# Patient Record
Sex: Male | Born: 1960 | Race: White | Hispanic: No | Marital: Married | Smoking: Never smoker
Health system: Southern US, Community
[De-identification: ages and names within clinical notes are randomized; demographics above are authoritative.]

## PROBLEM LIST (undated history)

## (undated) DIAGNOSIS — I10 Essential (primary) hypertension: Secondary | ICD-10-CM

## (undated) DIAGNOSIS — J309 Allergic rhinitis, unspecified: Secondary | ICD-10-CM

## (undated) DIAGNOSIS — Z860100 Personal history of colon polyps, unspecified: Secondary | ICD-10-CM

## (undated) DIAGNOSIS — J019 Acute sinusitis, unspecified: Secondary | ICD-10-CM

## (undated) DIAGNOSIS — R51 Headache: Secondary | ICD-10-CM

## (undated) DIAGNOSIS — E7800 Pure hypercholesterolemia, unspecified: Secondary | ICD-10-CM

## (undated) DIAGNOSIS — K649 Unspecified hemorrhoids: Secondary | ICD-10-CM

## (undated) HISTORY — DX: Essential (primary) hypertension: I10

## (undated) HISTORY — DX: Personal history of colonic polyps: Z86.010

## (undated) HISTORY — DX: Personal history of colon polyps, unspecified: Z86.0100

## (undated) HISTORY — DX: Allergic rhinitis, unspecified: J30.9

## (undated) HISTORY — DX: Unspecified hemorrhoids: K64.9

## (undated) HISTORY — PX: COLONOSCOPY: SHX174

## (undated) HISTORY — DX: Acute sinusitis, unspecified: J01.90

## (undated) HISTORY — DX: Pure hypercholesterolemia, unspecified: E78.00

---

## 2001-01-09 ENCOUNTER — Encounter: Admission: RE | Admit: 2001-01-09 | Discharge: 2001-01-09 | Payer: Self-pay | Admitting: Family Medicine

## 2001-01-31 ENCOUNTER — Encounter: Admission: RE | Admit: 2001-01-31 | Discharge: 2001-01-31 | Payer: Self-pay | Admitting: Family Medicine

## 2001-04-16 ENCOUNTER — Encounter: Admission: RE | Admit: 2001-04-16 | Discharge: 2001-04-16 | Payer: Self-pay | Admitting: Family Medicine

## 2002-01-20 ENCOUNTER — Encounter: Admission: RE | Admit: 2002-01-20 | Discharge: 2002-01-20 | Payer: Self-pay | Admitting: Family Medicine

## 2005-02-03 ENCOUNTER — Ambulatory Visit: Payer: Self-pay | Admitting: Pulmonary Disease

## 2005-03-29 ENCOUNTER — Ambulatory Visit: Payer: Self-pay | Admitting: Pulmonary Disease

## 2006-04-09 ENCOUNTER — Ambulatory Visit: Payer: Self-pay | Admitting: Pulmonary Disease

## 2006-08-24 ENCOUNTER — Ambulatory Visit: Payer: Self-pay | Admitting: Pulmonary Disease

## 2007-07-10 ENCOUNTER — Ambulatory Visit: Payer: Self-pay | Admitting: Pulmonary Disease

## 2007-07-10 LAB — CONVERTED CEMR LAB
BUN: 8 mg/dL (ref 6–23)
Basophils Absolute: 0 10*3/uL (ref 0.0–0.1)
Basophils Relative: 0.8 % (ref 0.0–1.0)
CO2: 31 meq/L (ref 19–32)
Chloride: 102 meq/L (ref 96–112)
Cholesterol: 213 mg/dL (ref 0–200)
Eosinophils Relative: 0.7 % (ref 0.0–5.0)
GFR calc Af Amer: 103 mL/min
HCT: 45.7 % (ref 39.0–52.0)
Lymphocytes Relative: 37.7 % (ref 12.0–46.0)
MCV: 92.6 fL (ref 78.0–100.0)
Neutro Abs: 2.2 10*3/uL (ref 1.4–7.7)
Neutrophils Relative %: 49.8 % (ref 43.0–77.0)
Potassium: 4.4 meq/L (ref 3.5–5.1)
RBC: 4.94 M/uL (ref 4.22–5.81)
RDW: 11.7 % (ref 11.5–14.6)
Sodium: 140 meq/L (ref 135–145)
TSH: 1.32 microintl units/mL (ref 0.35–5.50)
Triglycerides: 39 mg/dL (ref 0–149)
VLDL: 8 mg/dL (ref 0–40)
WBC: 4.3 10*3/uL — ABNORMAL LOW (ref 4.5–10.5)

## 2007-07-12 ENCOUNTER — Telehealth (INDEPENDENT_AMBULATORY_CARE_PROVIDER_SITE_OTHER): Payer: Self-pay | Admitting: *Deleted

## 2007-07-15 ENCOUNTER — Telehealth (INDEPENDENT_AMBULATORY_CARE_PROVIDER_SITE_OTHER): Payer: Self-pay | Admitting: *Deleted

## 2008-02-13 ENCOUNTER — Ambulatory Visit: Payer: Self-pay | Admitting: Pulmonary Disease

## 2008-02-13 DIAGNOSIS — I10 Essential (primary) hypertension: Secondary | ICD-10-CM | POA: Insufficient documentation

## 2008-02-13 DIAGNOSIS — J309 Allergic rhinitis, unspecified: Secondary | ICD-10-CM | POA: Insufficient documentation

## 2008-02-17 ENCOUNTER — Telehealth (INDEPENDENT_AMBULATORY_CARE_PROVIDER_SITE_OTHER): Payer: Self-pay | Admitting: *Deleted

## 2008-03-13 ENCOUNTER — Telehealth: Payer: Self-pay | Admitting: Internal Medicine

## 2008-03-13 ENCOUNTER — Telehealth (INDEPENDENT_AMBULATORY_CARE_PROVIDER_SITE_OTHER): Payer: Self-pay | Admitting: *Deleted

## 2008-03-17 ENCOUNTER — Ambulatory Visit: Payer: Self-pay | Admitting: Internal Medicine

## 2008-07-07 ENCOUNTER — Ambulatory Visit: Payer: Self-pay | Admitting: Internal Medicine

## 2009-11-15 ENCOUNTER — Telehealth (INDEPENDENT_AMBULATORY_CARE_PROVIDER_SITE_OTHER): Payer: Self-pay | Admitting: *Deleted

## 2009-11-18 ENCOUNTER — Ambulatory Visit: Payer: Self-pay | Admitting: Pulmonary Disease

## 2009-11-24 ENCOUNTER — Ambulatory Visit: Payer: Self-pay | Admitting: Pulmonary Disease

## 2009-11-28 LAB — CONVERTED CEMR LAB
ALT: 28 units/L (ref 0–53)
AST: 23 units/L (ref 0–37)
Albumin: 3.9 g/dL (ref 3.5–5.2)
Alkaline Phosphatase: 78 units/L (ref 39–117)
BUN: 9 mg/dL (ref 6–23)
Bilirubin Urine: NEGATIVE
Calcium: 9.2 mg/dL (ref 8.4–10.5)
Chloride: 106 meq/L (ref 96–112)
Eosinophils Absolute: 0.1 10*3/uL (ref 0.0–0.7)
GFR calc non Af Amer: 108.67 mL/min (ref >60)
Glucose, Bld: 82 mg/dL (ref 70–99)
Hemoglobin: 14.8 g/dL (ref 13.0–17.0)
Leukocytes, UA: NEGATIVE
Lymphs Abs: 2 10*3/uL (ref 0.7–4.0)
MCV: 91.9 fL (ref 78.0–100.0)
Monocytes Relative: 9 % (ref 3.0–12.0)
Neutrophils Relative %: 49 % (ref 43.0–77.0)
Platelets: 222 10*3/uL (ref 150.0–400.0)
RDW: 11.9 % (ref 11.5–14.6)
Sodium: 144 meq/L (ref 135–145)
Specific Gravity, Urine: 1.02 (ref 1.000–1.030)
Total Bilirubin: 1.3 mg/dL — ABNORMAL HIGH (ref 0.3–1.2)
Triglycerides: 56 mg/dL (ref 0.0–149.0)
Urine Glucose: NEGATIVE mg/dL
WBC: 5.1 10*3/uL (ref 4.5–10.5)

## 2009-11-29 ENCOUNTER — Encounter: Payer: Self-pay | Admitting: Pulmonary Disease

## 2009-12-04 DIAGNOSIS — E785 Hyperlipidemia, unspecified: Secondary | ICD-10-CM

## 2009-12-09 ENCOUNTER — Encounter (INDEPENDENT_AMBULATORY_CARE_PROVIDER_SITE_OTHER): Payer: Self-pay | Admitting: *Deleted

## 2009-12-27 ENCOUNTER — Encounter (INDEPENDENT_AMBULATORY_CARE_PROVIDER_SITE_OTHER): Payer: Self-pay | Admitting: *Deleted

## 2009-12-29 ENCOUNTER — Ambulatory Visit: Payer: Self-pay | Admitting: Internal Medicine

## 2010-01-14 ENCOUNTER — Ambulatory Visit: Payer: Self-pay | Admitting: Internal Medicine

## 2010-01-21 ENCOUNTER — Encounter: Payer: Self-pay | Admitting: Internal Medicine

## 2010-07-22 ENCOUNTER — Telehealth (INDEPENDENT_AMBULATORY_CARE_PROVIDER_SITE_OTHER): Payer: Self-pay | Admitting: *Deleted

## 2010-07-29 ENCOUNTER — Ambulatory Visit: Payer: Self-pay | Admitting: Pulmonary Disease

## 2010-09-13 NOTE — Progress Notes (Signed)
Summary: labs  Phone Note Call from Patient Call back at Work Phone [phone]   Caller: Patient Call For: nadel Reason for Call: Talk to Nurse Summary of Call: pt would like to come in Thurssday morning 04/07 to have labs drawn for 04/13 appt with SN. Initial call taken by: Eugene Gavia,  November 15, 2009 3:10 PM  Follow-up for Phone Call        please advise if this is ok or not.  If ok, please advise what labs to draw along with dx codes.  thanks.  Aundra Millet Reynolds LPN  November 15, 2200 3:20 PM   labs are in computer for pt.  thanks Randell Loop CMA  November 16, 2009 9:27 AM   Additional Follow-up for Phone Call Additional follow up Details #1::        [facility] informing pt labs in computer for 11-18-2009.  Aundra Millet Reynolds LPN  November 16, 5425 10:00 AM

## 2010-09-13 NOTE — Assessment & Plan Note (Signed)
Summary: cpx/ mbw   Primary Care Provider:  Alroy Dust, MD  CC:  2.5 yr ROV & CPX....  History of Present Illness: 50 y/o WM here for a follow up visit & CPX... he enjoys excellent general medical health & has no new complaints or concerns... he takes Vasotec for mild HBP, otherw no regular meds...    Current Problems:   ALLERGIC RHINITIS (ICD-477.9) - hx allergic rhinitis symptoms treated OTC Prn... never had allergy testing etc...  HYPERTENSION (ICD-401.9) - hx mild HBP easily controlled w/ VASOTEC 5mg /d... BP= 136/80 & soimilar at home... tolerates med well & denies HA, fatigue, visual changes, CP, palipit, dizziness, syncope, dyspnea, edema, etc...   HYPERCHOLESTEROLEMIA, BORDERLINE (ICD-272.4) - hx mild elevations of TChol & LDL in the past... controlled on diet Rx alone...  ~  FLP 4/04 showed TChol 236, TG 64, HDL 56, LDL 167  ~  FLP 8/06 showed TChol 175, TG 43, HDL 49, LDL 118  ~  FLP 11/08 showed TChol 213, TG 39, HDL 63, LDL 136  ~  FLP 4/11 showed TChol 172, TG 56, HDL 53, LDL 107  HEMORRHOIDS (ICD-455.6) - he has ANUSOL HC cream for Prn use...  ~  4/11:  currently age 79 & due for screening colonoscopy...    Allergies (verified): No Known Drug Allergies  Comments:  Nurse/Medical Assistant: The patient's medications and allergies were reviewed with the patient and were updated in the Medication and Allergy Lists.  Past History:  Past Medical History:  SINUSITIS, ACUTE (ICD-461.9) ALLERGIC RHINITIS (ICD-477.9) HYPERTENSION (ICD-401.9) HYPERCHOLESTEROLEMIA, BORDERLINE (ICD-272.4) HEMORRHOIDS (ICD-455.6)  Family History: Father alive age 42 w/ HBP Mother died age 35 in MVA 1 Sister alive & well 2 Half-sisters in good general health  Social History: Married, Psychologist, occupational;  3 boys (preston 5, alexander 3, austin 1) never smoked occ etoh, socially Employ: Naval architect for [employer]  Review of Systems  The patient denies fever, chills,  sweats, anorexia, fatigue, weakness, malaise, weight loss, sleep disorder, blurring, diplopia, eye irritation, eye discharge, vision loss, eye pain, photophobia, earache, ear discharge, tinnitus, decreased hearing, nasal congestion, nosebleeds, sore throat, hoarseness, chest pain, palpitations, syncope, dyspnea on exertion, orthopnea, PND, peripheral edema, cough, dyspnea at rest, excessive sputum, hemoptysis, wheezing, pleurisy, nausea, vomiting, diarrhea, constipation, change in bowel habits, abdominal pain, melena, hematochezia, jaundice, gas/bloating, indigestion/heartburn, dysphagia, odynophagia, dysuria, hematuria, urinary frequency, urinary hesitancy, nocturia, incontinence, back pain, joint pain, joint swelling, muscle cramps, muscle weakness, stiffness, arthritis, sciatica, restless legs, leg pain at night, leg pain with exertion, rash, itching, dryness, suspicious lesions, paralysis, paresthesias, seizures, tremors, vertigo, transient blindness, frequent falls, frequent headaches, difficulty walking, depression, anxiety, memory loss, confusion, cold intolerance, heat intolerance, polydipsia, polyphagia, polyuria, unusual weight change, abnormal bruising, bleeding, enlarged lymph nodes, urticaria, allergic rash, hay fever, and recurrent infections.    Vital Signs:  Patient profile:   50 year old male Height:      68 inches Weight:      140.50 pounds BMI:     21.44 O2 Sat:      98 % on Room air Temp:     99.6 degrees F oral Pulse rate:   76 / minute BP sitting:   136 / 80  (right arm) Cuff size:   regular  Vitals Entered By: Randell Loop CMA (November 24, 2009 12:07 PM)  O2 Sat at Rest %:  98 O2 Flow:  Room air CC: 2.5 yr ROV & CPX... Is Patient Diabetic? No Pain Assessment Patient in pain?  no      Comments meds updated today   Physical Exam  Additional Exam:  WD, WN, 50 y/o WM in NAD... GENERAL:  Alert & oriented; pleasant & cooperative. HEENT:  /AT, EOM-wnl, PERRLA,  Fundi-benign, EACs-clear, TMs-wnl, NOSE-clear, THROAT-clear & wnl. NECK:  Supple w/ full ROM; no JVD; normal carotid impulses w/o bruits; no thyromegaly or nodules palpated; no lymphadenopathy. CHEST:  Clear to P & A; without wheezes/ rales/ or rhonchi. HEART:  Regular Rhythm; without murmurs/ rubs/ or gallops. ABDOMEN:  Soft & nontender; normal bowel sounds; no organomegaly or masses detected. RECTAL:  Neg - prostate 2+ & nontender w/o nodules; stool hematest neg. EXT: without deformities or arthritic changes; no varicose veins/ venous insuffic/ or edema. NEURO:  CN's intact; motor testing normal; sensory testing normal; gait normal & balance OK. DERM:  No lesions noted; no rash etc...     CXR  Procedure date:  11/24/2009  Findings:      CHEST - 2 VIEW Comparison: 08/24/2006   Findings:  The heart size and mediastinal contours are within normal limits.  Both lungs are clear.  The visualized skeletal structures are unremarkable.   IMPRESSION: No active cardiopulmonary disease.   Read By:  Jonne Ply,  M.D.   EKG  Procedure date:  11/24/2009  Findings:      Normal sinus rhythm with rate of:  76/ min... Tracing is WNL.Marland Kitchen. sl IVCD, NAD...  SN   MISC. Report  Procedure date:  11/24/2009  Findings:      Lipid Panel (LIPID)   Cholesterol               172 mg/dL                   1-610   Triglycerides             56.0 mg/dL                  9.6-045.4   HDL                       09.81 mg/dL                 >19.14   LDL Cholesterol      [H]  782 mg/dL                   9-56  BMP (METABOL)   Sodium                    144 mEq/L                   135-145   Potassium                 4.0 mEq/L                   3.5-5.1   Chloride                  106 mEq/L                   96-112   Carbon Dioxide            31 mEq/L                    19-32   Glucose  82 mg/dL                    16-10   BUN                       9 mg/dL                     9-60    Creatinine                0.8 mg/dL                   4.5-4.0   Calcium                   9.2 mg/dL                   9.8-11.9   GFR                       108.67 mL/min               >60  Hepatic/Liver Function Panel (HEPATIC)   Total Bilirubin      [H]  1.3 mg/dL                   1.4-7.8   Direct Bilirubin          0.2 mg/dL                   2.9-5.6   Alkaline Phosphatase      78 U/L                      39-117   AST                       23 U/L                      0-37   ALT                       28 U/L                      0-53   Total Protein             6.9 g/dL                    2.1-3.0   Albumin                   3.9 g/dL                    8.6-5.7  Comments:      CBC Platelet w/Diff (CBCD)   White Cell Count          5.1 K/uL                    4.5-10.5   Red Cell Count            4.73 Mil/uL                 4.22-5.81   Hemoglobin                14.8 g/dL                   84.6-96.0  Hematocrit                43.4 %                      39.0-52.0   MCV                       91.9 fl                     78.0-100.0   Platelet Count            222.0 K/uL                  150.0-400.0   Neutrophil %              49.0 %                      43.0-77.0   Lymphocyte %              39.8 %                      12.0-46.0   Monocyte %                9.0 %                       3.0-12.0   Eosinophils%              1.5 %                       0.0-5.0   Basophils %               0.7 %                       0.0-3.0  TSH (TSH)   FastTSH                   1.48 uIU/mL                 0.35-5.50  UDip Only (UDIP)   Color                     YELLOW   Clarity                   CLEAR                       Clear   Specific Gravity          1.020                       1.000 - 1.030   Urine Ph                  5.5                         5.0-8.0   Protein                   NEGATIVE                    Negative   Urine Glucose             NEGATIVE  Negative   Ketones                    NEGATIVE                    Negative   Urine Bilirubin           NEGATIVE                    Negative   Blood                     NEGATIVE                    Negative   Urobilinogen              0.2                         0.0 - 1.0   Leukocyte Esterace        NEGATIVE                    Negative   Nitrite                   NEGATIVE                    Negative  Prostate Specific Antigen (PSA)   PSA-Hyb                   0.51 ng/mL                  0.10-4.00   Impression & Recommendations:  Problem # 1:  PHYSICAL EXAMINATION (ICD-V70.0)  Orders: 12 Lead EKG (12 Lead EKG) Gastroenterology Referral (GI) T-2 View CXR ([id number])  Problem # 2:  HYPERTENSION (ICD-401.9) Controlled-  continue same med. His updated medication list for this problem includes:    Enalapril Maleate 5 Mg Tabs (Enalapril maleate) .Marland Kitchen... Take 1 tab by mouth once daily...  Orders: Prescription Created Electronically [phone])  Problem # 3:  HYPERCHOLESTEROLEMIA, BORDERLINE (ICD-272.4) FLP is improved-  continue diet Rx...  Problem # 4:  HEMORRHOIDS (ICD-455.6) He is due for screening colonoscopy...  Complete Medication List: 1)  Enalapril Maleate 5 Mg Tabs (Enalapril maleate) .... Take 1 tab by mouth once daily...  Patient Instructions: 1)  Today we updated your med list- see below.... 2)  We refilled your Enalapril for a 90 d supply as requested... 3)  Today we reviewed your recent blood work, and we did your follow up CXR & EKG>> please call the "phone tree" in a few days for your Xray results.Marland KitchenMarland Kitchen 4)  We will arrange for a colonoscopy to be sched at your convenience... 5)  Call for any problems.Marland KitchenMarland Kitchen 6)  Please schedule a follow-up appointment in 1 year. Prescriptions: ENALAPRIL MALEATE 5 MG TABS (ENALAPRIL MALEATE) take 1 tab by mouth once daily...  #90 x prn   Entered and Authorized by:   Michele Mcalpine MD   Signed by:   Michele Mcalpine MD on 11/24/2009   Method used:   Print then Give to Patient    RxID:   [id number]    CardioPerfect ECG  ID: [id number] Patient: Raymond Hayes DOB: 23-Aug-1960 Age: 50 Years Old Sex: Male Race: White Physician: Kristia Jupiter Technician: Randell Loop CMA Height: 68 Weight: 140.50 Status: Unconfirmed Past Medical History:  HYPERTENSION (ICD-401.9)--controlled on vasotec 5mg   once daily   HYPERLIPIDEMIA (ICD-272.4)--diet controlled.  Chol: 213 (07/10/2007)   HDL: 63.4 (07/10/2007)   LDL: 136.4 (07/10/2007)   TG: 39 (07/10/2007) SGOT: 25 (07/10/2007)   SGPT: 24 (07/10/2007)--  ALLERGIC RHINITIS (ICD-477.9)--intermittent flares.   Hemorrhoids. --GI eval. 9/09- rx anusol, declined colonoscopy at that time.    Td 2007 Recorded: 11/24/2009 12:18 AM P/PR: 95 ms / 148 ms - Heart rate (maximum exercise) QRS: 109 QT/QTc/QTd: 400 ms / 428 ms / 30 ms - Heart rate (maximum exercise)  P/QRS/T axis: 79 deg / 91 deg / 61 deg - Heart rate (maximum exercise)  Heartrate: 76 bpm  Interpretation:  Normal sinus rhythm with rate of:  76/ min... Tracing is WNL.Marland Kitchen. sl IVCD, NAD...  SN

## 2010-09-13 NOTE — Procedures (Signed)
Summary: Colonoscopy  Patient: Raymond Hayes Note: All result statuses are Final unless otherwise noted.  Tests: (1) Colonoscopy (COL)   COL Colonoscopy           DONE     [facility]     [address]     [place], [place]  [address]           COLONOSCOPY PROCEDURE REPORT           PATIENT:  Raymond, Hayes  MR#:  [id number]     BIRTHDATE:  [birth year], 49 yrs. old  GENDER:  male     ENDOSCOPIST:  Iva Boop, MD, [facility]     REF. BY:     PROCEDURE DATE:  01/14/2010     PROCEDURE:  Colonoscopy with snare polypectomy     ASA CLASS:  Class I     INDICATIONS:  Routine Risk Screening     MEDICATIONS:   Fentanyl 75 mcg IV, Versed mg IV           DESCRIPTION OF PROCEDURE:   After the risks benefits and     alternatives of the procedure were thoroughly explained, informed     consent was obtained.  Digital rectal exam was performed and     revealed no abnormalities and normal prostate.   The LB CF-[id number]     [id number] endoscope was introduced through the anus and advanced to     the cecum, which was identified by both the appendix and ileocecal     valve, without limitations.  The quality of the prep was     excellent, using MoviPrep.  The instrument was then slowly     withdrawn as the colon was fully examined.     Insertion: 2:30 minutes Withdrawal: 7:08 minutes     <<PROCEDUREIMAGES>>           FINDINGS:  A diminutive polyp was found in the rectum. It was 5 mm     in size. Polyp was snared without cautery. Retrieval was     successful. snare polyp  The terminal ileum appeared normal.  This     was otherwise a normal examination of the colon.   Retroflexed     views in the rectum revealed internal hemorrhoids.    The scope     was then withdrawn from the patient and the procedure completed.           COMPLICATIONS:  None     ENDOSCOPIC IMPRESSION:     1) 5 mm diminutive polyp in the rectum - removed     2) Internal hemorrhoids     3) Otherwise normal examination of colon and  terminal ileum           REPEAT EXAM:  In for Colonoscopy, pending biopsy results.           Iva Boop, MD, [facility]           CC:  The Patient     Alroy Dust, MD           n.     eSIGNED:   Iva Boop at 01/14/2010 03:47 PM           Mauricio Po, [id number]  Note: An exclamation mark (!) indicates a result that was not dispersed into the flowsheet. Document Creation Date: 01/14/2010 3:47 PM _______________________________________________________________________  (1) Order result status: Final Collection or observation date-time: 01/14/2010 15:38 Requested date-time:  Receipt date-time:  Reported date-time:  Referring Physician:   Ordering Physician: Stan Head [id number]) Specimen Source:  Source: Launa Grill Order Number: [id number] Lab site:   Appended Document: Colonoscopy     Procedures Next Due Date:    Colonoscopy: 01/2015

## 2010-09-13 NOTE — Miscellaneous (Signed)
Summary: [facility] Previsit/prep  Clinical Lists Changes  Medications: Added new medication of MOVIPREP 100 GM  SOLR (PEG-KCL-NACL-NASULF-NA ASC-C) As per prep instructions. - Signed Rx of MOVIPREP 100 GM  SOLR (PEG-KCL-NACL-NASULF-NA ASC-C) As per prep instructions.;  #1 x 0;  Signed;  Entered by: Wyona Almas RN;  Authorized by: Iva Boop MD, [facility];  Method used: Electronically to [facility] [address], [place], [place], [place]  [address], Ph: [phone], Fax: [phone] Observations: Added new observation of ALLERGY REV: Done (12/29/2009 13:00)    Prescriptions: MOVIPREP 100 GM  SOLR (PEG-KCL-NACL-NASULF-NA ASC-C) As per prep instructions.  #1 x 0   Entered by:   Wyona Almas RN   Authorized by:   Iva Boop MD, [facility]   Signed by:   Wyona Almas RN on 12/29/2009   Method used:   Electronically to        [facility] (retail)       [address]       [place], [place]  [address]       Ph: [phone]       Fax: [phone]   RxID:   [id number]

## 2010-09-13 NOTE — Letter (Signed)
Summary: Previsit letter  [facility]  [address] [place], [place] [address]   Phone: [phone]  Fax: [phone]       12/09/2009 MRN: [id number]  [facility] [place] [address], [place]  [address]  Dear Mr. Raymond Hayes,  Welcome to the Gastroenterology Division at [facility].    You are scheduled to see a nurse for your pre-procedure visit on 12/29/2009 at 1:00PM on the 3rd floor at [facility], [address]  We ask that you try to arrive at our office 15 minutes prior to your appointment time to allow for check-in.  Your nurse visit will consist of discussing your medical and surgical history, your immediate family medical history, and your medications.    Please bring a complete list of all your medications or, if you prefer, bring the medication bottles and we will list them.  We will need to be aware of both prescribed and over the counter drugs.  We will need to know exact dosage information as well.  If you are on blood thinners (Coumadin, Plavix, Aggrenox, Ticlid, etc.) please call our office today/prior to your appointment, as we need to consult with your physician about holding your medication.   Please be prepared to read and sign documents such as consent forms, a financial agreement, and acknowledgement forms.  If necessary, and with your consent, a friend or relative is welcome to sit-in on the nurse visit with you.  Please bring your insurance card so that we may make a copy of it.  If your insurance requires a referral to see a specialist, please bring your referral form from your primary care physician.  No co-pay is required for this nurse visit.     If you cannot keep your appointment, please call [phone] to cancel or reschedule prior to your appointment date.  This allows [place] the opportunity to schedule an appointment for another patient in need of care.    Thank you for choosing [facility] for your medical needs.  We  appreciate the opportunity to care for you.  Please visit [place] at our website  to learn more about our practice.                     Sincerely.                                                                                                                   The Gastroenterology Division

## 2010-09-13 NOTE — Letter (Signed)
SummaryScience writer Pulmonary Care Appointment Letter  [facility]  [address]   [place], [place] [address]   Phone: [phone]  Fax: [phone]    11/29/2009 MRN: [id number]  [facility] [address], [place]  [address]  Dear Raymond Hayes,   Our office is attempting to contact you about an appointment.  Please call our office at [phone] to schedule this appointment with Dr.________________.  Our registration staff is prepared to assist you with any questions you may have.    Thank you,   Nature conservation officer Pulmonary Division

## 2010-09-13 NOTE — Progress Notes (Signed)
Summary: bp problems-spouse called again-waiting on call back  Phone Note Call from Patient Call back at Home Phone [phone]   Caller: Spouse--stephanie Call For: nadel Reason for Call: Talk to Nurse Summary of Call: Patient's wife calling about husband's bp.  He has been feeling "off" for the past couple of weeks, yesterday he felt dizzy and when he checked his bp it was 184/95.  Patient is on bp medication, but wife did not know name of med.  Requesting appt. Initial call taken by: Lehman Prom,  July 22, 2010 9:03 AM  Follow-up for Phone Call        Called and spoke with pt.  He c/o feeling fatigued x 2 wks. When he woke up yesterday he felt dizzy and felt that way throughout the whole day, went to [facility] and had BP checked and it was 185/90.  He is currently taking enalapril 5 mg daily.  No appts available today pls advise, thanks! Follow-up by: Vernie Murders,  July 22, 2010 9:27 AM  Additional Follow-up for Phone Call Additional follow up Details #1::        spouse called again concerned that no one has called back yet. i advised that i spoke to [place]. she will get dr Jodelle Green recs as soon as she can and call back. i advised spouse that if she feels she needs to take pt to urgent care of er then pls do so. she says she hopes pt will be ok until they hear back from dr nadel. Tivis Ringer, CNA  July 22, 2010 2:23 PM  spoke with Marliss Czar, she is aware that pt has called back. Boone Master CNA/MA  July 22, 2010 2:43 PM     Additional Follow-up for Phone Call Additional follow up Details #2::    per SN----on vasotec 5mg    reminder for no salt in diet, no otc meds like decongestants--and increase the vasotec to  1 by mouth two times a day ---rov with SN next week or two---ok to add pt on friday at 10am.  thanks Randell Loop CMA  July 22, 2010 3:04 PM   Spoke with pt and notified of recs per SN.  Pt verbalized understanding and appt was sched for 07/29/10.     Follow-up by: Vernie Murders,  July 22, 2010 3:16 PM

## 2010-09-13 NOTE — Letter (Signed)
Summary: [facility] Instructions  [facility]  [address] [place], [place] [address]   Phone: [phone]  Fax: [phone]       Raymond Hayes    05/16/1961    MRN: [id number]        Procedure Day Dorna Bloom:  Farrell Ours  01/14/10     Arrival Time:  12:30PM     Procedure Time:  1:30PM     Location of Procedure:                    _X _  [facility] (4th Floor)                       PREPARATION FOR COLONOSCOPY WITH MOVIPREP   Starting 5 days prior to your procedure 01/09/10 do not eat nuts, seeds, popcorn, corn, beans, peas,  salads, or any raw vegetables.  Do not take any fiber supplements (e.g. Metamucil, Citrucel, and Benefiber).  THE DAY BEFORE YOUR PROCEDURE         DATE: 01/13/10  DAY: THURSDAY  1.  Drink clear liquids the entire day-NO SOLID FOOD  2.  Do not drink anything colored red or purple.  Avoid juices with pulp.  No orange juice.  3.  Drink at least 64 oz. (8 glasses) of fluid/clear liquids during the day to prevent dehydration and help the prep work efficiently.  CLEAR LIQUIDS INCLUDE: Water Jello Ice Popsicles Tea (sugar ok, no milk/cream) Powdered fruit flavored drinks Coffee (sugar ok, no milk/cream) Gatorade Juice: apple, white grape, white cranberry  Lemonade Clear bullion, consomm, broth Carbonated beverages (any kind) Strained chicken noodle soup Hard Candy                             4.  In the morning, mix first dose of MoviPrep solution:    Empty 1 Pouch A and 1 Pouch B into the disposable container    Add lukewarm drinking water to the top line of the container. Mix to dissolve    Refrigerate (mixed solution should be used within 24 hrs)  5.  Begin drinking the prep at 5:00 p.m. The MoviPrep container is divided by 4 marks.   Every 15 minutes drink the solution down to the next mark (approximately 8 oz) until the full liter is complete.   6.  Follow completed prep with 16 oz of clear liquid of your choice (Nothing red  or purple).  Continue to drink clear liquids until bedtime.  7.  Before going to bed, mix second dose of MoviPrep solution:    Empty 1 Pouch A and 1 Pouch B into the disposable container    Add lukewarm drinking water to the top line of the container. Mix to dissolve    Refrigerate  THE DAY OF YOUR PROCEDURE      DATE: 01/14/10  DAY: FRIDAY  Beginning at 8:30AM (5 hours before procedure):         1. Every 15 minutes, drink the solution down to the next mark (approx 8 oz) until the full liter is complete.  2. Follow completed prep with 16 oz. of clear liquid of your choice.    3. You may drink clear liquids until 11:30AM (2 HOURS BEFORE PROCEDURE).   MEDICATION INSTRUCTIONS  Unless otherwise instructed, you should take regular prescription medications with a small sip of water   as early as possible the morning of  your procedure.         OTHER INSTRUCTIONS  You will need a responsible adult at least 51 years of age to accompany you and drive you home.   This person must remain in the waiting room during your procedure.  Wear loose fitting clothing that is easily removed.  Leave jewelry and other valuables at home.  However, you may wish to bring a book to read or  an iPod/MP3 player to listen to music as you wait for your procedure to start.  Remove all body piercing jewelry and leave at home.  Total time from sign-in until discharge is approximately 2-3 hours.  You should go home directly after your procedure and rest.  You can resume normal activities the  day after your procedure.  The day of your procedure you should not:   Drive   Make legal decisions   Operate machinery   Drink alcohol   Return to work  You will receive specific instructions about eating, activities and medications before you leave.    The above instructions have been reviewed and explained to me by   Wyona Almas RN  Dec 29, 2009 1:26 PM     I fully understand and can  verbalize these instructions _____________________________ Date _________

## 2010-09-13 NOTE — Letter (Signed)
Summary: Patient Notice- Polyp Results  [facility]  [address] [place], [place] [address]   Phone: [phone]  Fax: [phone]        January 21, 2010 MRN: [id number]    [facility] [address] [place], [place]  [address]    Dear Raymond Hayes,  The polyp removed from your colon was adenomatous. This means that it was pre-cancerous or that  it had the potential to change into cancer over time.  I recommend that you have a repeat colonoscopy in 5 years to determine if you have developed any new polyps over time. If you develop any new rectal bleeding, abdominal pain or significant bowel habit changes, please contact us before then.  Please call us if you are having persistent problems or have questions about your condition that have not been fully answered at this time.  Sincerely,  Iva Boop MD, [facility]  This letter has been electronically signed by your physician.  Appended Document: Patient Notice- Polyp Results letter mailed.

## 2010-09-15 NOTE — Assessment & Plan Note (Signed)
Summary: elevated BP//lmr   Primary Care Provider:  Alroy Dust, MD  CC:  Add-on appt for elev BP....  History of Present Illness: 50 y/o WM here for a follow up visit due to HBP...    ~  July 29, 2010:  he has been on Vasotec 5mg /d for mild HBP w/ good control... recently BP elev at home  ~180/90 by report assoc w/ fatigue, dizziness, etc... we increased Vasotec to 5mg  Bid by phone & BPs improved- he brings home med list w/ pressures up to 146/ 86 on this dose however... we discussed ch Rx to once daily pill> try LOTREL 5/10 daily...   Current Problems:   ALLERGIC RHINITIS (ICD-477.9) - hx allergic rhinitis symptoms treated OTC Prn... never had allergy testing etc...  HYPERTENSION (ICD-401.9) - hx mild HBP prev controlled w/ Vasotec 5mg /d... BP= 122/80 today & similar at home... tolerates med well & denies HA, fatigue, visual changes, CP, palipit, dizziness, syncope, dyspnea, edema, etc...   ~  12/11:  pt called w/ incr BP at home up to 180/90 & we decided to ch to LOTREL 5/10 daily...  HYPERCHOLESTEROLEMIA, BORDERLINE (ICD-272.4) - hx mild elevations of TChol & LDL in the past... controlled on diet Rx alone...  ~  FLP 4/04 showed TChol 236, TG 64, HDL 56, LDL 167  ~  FLP 8/06 showed TChol 175, TG 43, HDL 49, LDL 118  ~  FLP 11/08 showed TChol 213, TG 39, HDL 63, LDL 136  ~  FLP 4/11 showed TChol 172, TG 56, HDL 53, LDL 107  COLONIC POLYPS (ICD-211.3) - he had routine colonoscopy by DrGessner 6/11 at age 10 > showing 5mm polyp in rectum & bx= tub adenoma, & int hem...  HEMORRHOIDS (ICD-455.6) - he has ANUSOL HC cream for Prn use...   Preventive Screening-Counseling & Management  Alcohol-Tobacco     Alcohol type: social     Smoking Status: never  Allergies (verified): No Known Drug Allergies  Comments:  Nurse/Medical Assistant: The patient's medications and allergies were reviewed with the patient and were updated in the Medication and Allergy Lists.  Past  History:  Past Medical History: SINUSITIS, ACUTE (ICD-461.9) ALLERGIC RHINITIS (ICD-477.9) HYPERTENSION (ICD-401.9) HYPERCHOLESTEROLEMIA, BORDERLINE (ICD-272.4) COLONIC POLYPS (ICD-211.3) HEMORRHOIDS (ICD-455.6)  Family History: Reviewed history from 11/24/2009 and no changes required. Father alive age 80 w/ HBP Mother died age 33 in MVA 1 Sister alive & well 2 Half-sisters in good general health  Social History: Reviewed history from 11/24/2009 and no changes required. Married, wife= [place];  3 boys (preston 5, alexander 3, austin 1) never smoked occ etoh, socially Employ: Naval architect for [employer]  Review of Systems      See HPI  The patient denies anorexia, fever, weight loss, weight gain, vision loss, decreased hearing, hoarseness, chest pain, syncope, dyspnea on exertion, peripheral edema, prolonged cough, headaches, hemoptysis, abdominal pain, melena, hematochezia, severe indigestion/heartburn, hematuria, incontinence, muscle weakness, suspicious skin lesions, transient blindness, difficulty walking, depression, unusual weight change, abnormal bleeding, enlarged lymph nodes, and angioedema.    Vital Signs:  Patient profile:   50 year old male Height:      68 inches Weight:      144.50 pounds BMI:     22.05 O2 Sat:      100 % on Room air Temp:     97.7 degrees F oral Pulse rate:   80 / minute BP sitting:   122 / 80  (left arm) Cuff size:   regular  Vitals Entered  By: Randell Loop CMA (July 29, 2010 10:13 AM)  O2 Sat at Rest %:  100 O2 Flow:  Room air CC: Add-on appt for elev BP... Is Patient Diabetic? No Pain Assessment Patient in pain? no      Comments meds updated today with pt   Physical Exam  Additional Exam:  WD, WN, 50 y/o WM in NAD... GENERAL:  Alert & oriented; pleasant & cooperative. HEENT:  [place]/AT, EOM-wnl, PERRLA, Fundi-benign, EACs-clear, TMs-wnl, NOSE-clear, THROAT-clear & wnl. NECK:  Supple w/ full ROM; no JVD; normal  carotid impulses w/o bruits; no thyromegaly or nodules palpated; no lymphadenopathy. CHEST:  Clear to P & A; without wheezes/ rales/ or rhonchi. HEART:  Regular Rhythm; without murmurs/ rubs/ or gallops. ABDOMEN:  Soft & nontender; normal bowel sounds; no organomegaly or masses detected. RECTAL:  Neg - prostate 2+ & nontender w/o nodules; stool hematest neg. EXT: without deformities or arthritic changes; no varicose veins/ venous insuffic/ or edema. NEURO:  CN's intact; motor testing normal; sensory testing normal; gait normal & balance OK. DERM:  No lesions noted; no rash etc...    Impression & Recommendations:  Problem # 1:  HYPERTENSION (ICD-401.9) As noted>  we decided to change to LOTREL 5/10... The following medications were removed from the medication list:    Enalapril Maleate 5 Mg Tabs (Enalapril maleate) .Marland Kitchen... Take 1 tab by mouth two times a day His updated medication list for this problem includes:    Amlodipine Besy-benazepril Hcl 5-10 Mg Caps (Amlodipine besy-benazepril hcl) .Marland Kitchen... Take 1 tab by mouth once daily...  Problem # 2:  HYPERCHOLESTEROLEMIA, BORDERLINE (ICD-272.4) On diet & exercise...  Problem # 3:  COLONIC POLYPS (ICD-211.3) s/p colonoscopy 6/11 by DrGessner w/ tub adenoma removed... f/u 45yrs.  Complete Medication List: 1)  Amlodipine Besy-benazepril Hcl 5-10 Mg Caps (Amlodipine besy-benazepril hcl) .... Take 1 tab by mouth once daily...  Patient Instructions: 1)  Today we updated your med list- see below.... 2)  We decided to change your BP med to a combination pill called LOTREL (Amlodipine/ Benazepril) take one tab daily.Marland KitchenMarland Kitchen 3)  Continue to monitor your BP at home... 4)  Call for any questions... Prescriptions: AMLODIPINE BESY-BENAZEPRIL HCL 5-10 MG CAPS (AMLODIPINE BESY-BENAZEPRIL HCL) take 1 tab by mouth once daily...  #90 x 4   Entered and Authorized by:   Michele Mcalpine MD   Signed by:   Michele Mcalpine MD on 07/29/2010   Method used:   Print then Give  to Patient   RxID:   [id number]    Immunization History:  Influenza Immunization History:    Influenza:  historical (05/24/2010)

## 2010-10-06 ENCOUNTER — Telehealth (INDEPENDENT_AMBULATORY_CARE_PROVIDER_SITE_OTHER): Payer: Self-pay | Admitting: *Deleted

## 2010-10-11 NOTE — Progress Notes (Signed)
Summary: Lotrel Refill  Phone Note Call from Patient Call back at Home Phone [phone]   Caller: Spouse--stephanie--[phone] Call For: nadel Reason for Call: Refill Medication, Talk to Nurse Summary of Call: Patient saw Dr. Kriste Basque in December and SN changed his bp med.  Patient has since lost the prescription and is asking [place] to refill.  LOTREL (Amlodipine/ Benazepril) [facility] Initial call taken by: Lehman Prom,  October 06, 2010 3:48 PM  Follow-up for Phone Call        med has been sent to the pharmacy with refills---attempted to call pt but line is busy--will try back Randell Loop [facility]  October 06, 2010 3:51 PM   Pt's spouse aware RX has been sent to the pharmacy.Michel Bickers [address] [facility]  October 06, 2010 4:14 PM    Prescriptions: AMLODIPINE BESY-BENAZEPRIL HCL 5-10 MG CAPS (AMLODIPINE BESY-BENAZEPRIL HCL) take 1 tab by mouth once daily...  #90 x 6   Entered by:   Randell Loop CMA   Authorized by:   Michele Mcalpine MD   Signed by:   Randell Loop CMA on 10/06/2010   Method used:   Electronically to        [facility] (retail)       [address]       [place], [place]  [address]       Ph: [phone]       Fax: [phone]   RxID:   [id number]

## 2011-03-03 ENCOUNTER — Telehealth: Payer: Self-pay | Admitting: Pulmonary Disease

## 2011-03-03 MED ORDER — AMLODIPINE BESY-BENAZEPRIL HCL 5-10 MG PO CAPS: 1.0000 | ORAL_CAPSULE | Freq: Every day | ORAL | Status: DC

## 2011-03-03 NOTE — Telephone Encounter (Signed)
Spoke with pt wife and she states that the new insurance does not go into effect until 03-15-11 so that is why the pt wants to pick up RX to have on hane to send at that time. Rx printed and placed on SN look-at to sign. Pt aware that it will be ready to pick up later today. Carron Curie, CMA

## 2012-02-13 ENCOUNTER — Ambulatory Visit (INDEPENDENT_AMBULATORY_CARE_PROVIDER_SITE_OTHER): Payer: BC Managed Care – PPO | Admitting: Pulmonary Disease

## 2012-02-13 ENCOUNTER — Encounter: Payer: Self-pay | Admitting: Pulmonary Disease

## 2012-02-13 VITALS — BP 130/84 | HR 71 | Temp 97.1°F | Ht 68.0 in | Wt 146.4 lb

## 2012-02-13 DIAGNOSIS — D126 Benign neoplasm of colon, unspecified: Secondary | ICD-10-CM

## 2012-02-13 DIAGNOSIS — E785 Hyperlipidemia, unspecified: Secondary | ICD-10-CM

## 2012-02-13 DIAGNOSIS — J309 Allergic rhinitis, unspecified: Secondary | ICD-10-CM

## 2012-02-13 DIAGNOSIS — I10 Essential (primary) hypertension: Secondary | ICD-10-CM

## 2012-02-13 DIAGNOSIS — K649 Unspecified hemorrhoids: Secondary | ICD-10-CM

## 2012-02-13 MED ORDER — AMLODIPINE BESY-BENAZEPRIL HCL 5-10 MG PO CAPS: 1.0000 | ORAL_CAPSULE | Freq: Every day | ORAL | Status: DC

## 2012-02-13 NOTE — Patient Instructions (Addendum)
Today we updated your med list in our EPIC system...    Continue your current medications the same...  Call for any problems or if we can be of service in any way...  Let's plan a Physical next yr w/ Fasting blood work, CXR, & EKG.Marland KitchenMarland Kitchen

## 2012-02-13 NOTE — Progress Notes (Signed)
Subjective:     Patient ID: Raymond Hayes, male   DOB: Dec 05, 1960, 51 y.o.   MRN: [id number]  HPI 51 y/o WM here for a follow up visit due to HBP...   ~  July 29, 2010:  he has been on Vasotec 5mg /d for mild HBP w/ good control... recently BP elev at home ~180/90 by report assoc w/ fatigue, dizziness, etc... we increased Vasotec to 5mg  Bid by phone & BPs improved- he brings home med list w/ pressures up to 146/ 86 on this dose however... we discussed ch Rx to once daily pill> try LOTREL 5/10 daily...  ~  February 13, 2012:  65mo ROV & Raymond Hayes continues to do well> no new complaints or concerns;  BP remains well controlled on Lotrel 5-10 & he is tolerating very well;  We reviewed prev XRays & labs- he is not fasting today & wants to wait til next yr for follow up CPX & he is low risk...    We reviewed prob list, meds, xrays and labs> see below>>   Problem List:   ALLERGIC RHINITIS (ICD-477.9) - hx allergic rhinitis symptoms treated OTC Prn... never had allergy testing etc...  HYPERTENSION (ICD-401.9) - hx mild HBP prev controlled w/ Vasotec 5mg /d...  ~  4/11:  BP= 122/80 today & similar at home... tolerates med well & denies HA, fatigue, visual changes, CP, palipit, dizziness, syncope, dyspnea, edema, etc...  ~  12/11:  pt called w/ incr BP at home up to 180/90 & we decided to ch to LOTREL 5/10 daily... ~  7/13:  BP= 130/84 & he remains asymptomatic  HYPERCHOLESTEROLEMIA, BORDERLINE (ICD-272.4) - hx mild elevations of TChol & LDL in the past... controlled on diet Rx alone... ~  FLP 4/04 showed TChol 236, TG 64, HDL 56, LDL 167 ~  FLP 8/06 showed TChol 175, TG 43, HDL 49, LDL 118 ~  FLP 11/08 showed TChol 213, TG 39, HDL 63, LDL 136 ~  FLP 4/11 showed TChol 172, TG 56, HDL 53, LDL 107  COLONIC POLYPS (ICD-211.3) - he had routine colonoscopy by DrGessner 6/11 at age 83 > showing 5mm polyp in rectum & bx= tub adenoma, & int hem...  HEMORRHOIDS (ICD-455.6) - he has ANUSOL HC cream for Prn  use...   No past surgical history on file.   Outpatient Encounter Prescriptions as of 02/13/2012  Medication Sig Dispense Refill  . amLODipine-benazepril (LOTREL) 5-10 MG per capsule Take 1 capsule by mouth daily.  90 capsule  3    No Known Allergies   Current Medications, Allergies, Past Medical History, Past Surgical History, Family History, and Social History were reviewed in [employer] record.   Review of Systems        See HPI - all other systems neg except as noted...  The patient denies anorexia, fever, weight loss, weight gain, vision loss, decreased hearing, hoarseness, chest pain, syncope, dyspnea on exertion, peripheral edema, prolonged cough, headaches, hemoptysis, abdominal pain, melena, hematochezia, severe indigestion/heartburn, hematuria, incontinence, muscle weakness, suspicious skin lesions, transient blindness, difficulty walking, depression, unusual weight change, abnormal bleeding, enlarged lymph nodes, and angioedema.     Objective:   Physical Exam     WD, WN, 51 y/o WM in NAD... GENERAL:  Alert & oriented; pleasant & cooperative. HEENT:  [place]/AT, EOM-wnl, PERRLA, Fundi-benign, EACs-clear, TMs-wnl, NOSE-clear, THROAT-clear & wnl. NECK:  Supple w/ full ROM; no JVD; normal carotid impulses w/o bruits; no thyromegaly or nodules palpated; no lymphadenopathy. CHEST:  Clear  to P & A; without wheezes/ rales/ or rhonchi. HEART:  Regular Rhythm; without murmurs/ rubs/ or gallops. ABDOMEN:  Soft & nontender; normal bowel sounds; no organomegaly or masses detected. (RECTAL:  Neg - prostate 2+ & nontender w/o nodules; stool hematest neg) EXT: without deformities or arthritic changes; no varicose veins/ venous insuffic/ or edema. NEURO:  CN's intact; motor testing normal; sensory testing normal; gait normal & balance OK. DERM:  No lesions noted; no rash etc...  RADIOLOGY DATA:  Reviewed in the EPIC EMR & discussed w/ the patient...  LABORATORY  DATA:  Reviewed in the EPIC EMR & discussed w/ the patient...   Assessment:     HBP>  On Lotrel 5-10 w/ good control, tolerated well, continue same Rx...  Chol>  He has mild elev LDL in the past but doesn't want meds; "I'm a diet guy" he says; his weight is normal w/ BMI~22...  Hx Colon Polyps and Hems>  He had colonoscopy 2011 by drGessner w/ sm adenoma removed; has AnusolHC for prn use...     Plan:     Patient's Medications  New Prescriptions   No medications on file  Previous Medications   No medications on file  Modified Medications   Modified Medication Previous Medication   AMLODIPINE-BENAZEPRIL (LOTREL) 5-10 MG PER CAPSULE amLODipine-benazepril (LOTREL) 5-10 MG per capsule      Take 1 capsule by mouth daily.    Take 1 capsule by mouth daily.  Discontinued Medications   No medications on file

## 2012-04-05 ENCOUNTER — Telehealth: Payer: Self-pay | Admitting: Pulmonary Disease

## 2012-04-05 NOTE — Telephone Encounter (Signed)
Called and spoke with pt and he stated that he has been having this feeling of being dizzy--just not feeling like himself x 3 weeks.  He stated that he c/o of left foot feeling funny and now it is both feet.  Pt very concerned about this and requested to see SN for these symptoms.  appt has been made for pt to see SN on 8-27 at 2:30.  Pt is aware.

## 2012-04-09 ENCOUNTER — Encounter: Payer: Self-pay | Admitting: Pulmonary Disease

## 2012-04-09 ENCOUNTER — Ambulatory Visit (INDEPENDENT_AMBULATORY_CARE_PROVIDER_SITE_OTHER): Payer: BC Managed Care – PPO | Admitting: Pulmonary Disease

## 2012-04-09 VITALS — BP 126/80 | HR 73 | Temp 97.7°F | Ht 68.0 in | Wt 145.0 lb

## 2012-04-09 DIAGNOSIS — D126 Benign neoplasm of colon, unspecified: Secondary | ICD-10-CM

## 2012-04-09 DIAGNOSIS — R209 Unspecified disturbances of skin sensation: Secondary | ICD-10-CM

## 2012-04-09 DIAGNOSIS — E785 Hyperlipidemia, unspecified: Secondary | ICD-10-CM

## 2012-04-09 DIAGNOSIS — N139 Obstructive and reflux uropathy, unspecified: Secondary | ICD-10-CM

## 2012-04-09 DIAGNOSIS — I10 Essential (primary) hypertension: Secondary | ICD-10-CM

## 2012-04-09 DIAGNOSIS — R202 Paresthesia of skin: Secondary | ICD-10-CM

## 2012-04-09 DIAGNOSIS — N320 Bladder-neck obstruction: Secondary | ICD-10-CM

## 2012-04-09 NOTE — Progress Notes (Signed)
Subjective:     Patient ID: Raymond Hayes, male   DOB: 02/22/1961, 51 y.o.   MRN: [id number]  HPI 51 y/o WM here for a follow up visit due to HBP...   ~  July 29, 2010:  Raymond Hayes has been on Vasotec 5mg /d for mild HBP w/ good control... recently BP elev at home ~180/90 by report assoc w/ fatigue, dizziness, etc... we increased Vasotec to 5mg  Bid by phone & BPs improved- Raymond Hayes brings home med list w/ pressures up to 146/ 86 on this dose however... we discussed ch Rx to once daily pill> try LOTREL 5/10 daily...  ~  February 13, 2012:  63mo ROV & Audry continues to do well> no new complaints or concerns;  BP remains well controlled on Lotrel 5-10 & Raymond Hayes is tolerating very well;  We reviewed prev XRays & labs- Raymond Hayes is not fasting today & wants to wait til next yr for follow up CPX & Raymond Hayes is low risk...    We reviewed prob list, meds, xrays and labs> see below>>  ~  April 09, 2012:  29mo ROV & add-on for dizziness x 3wks "not feeling like myself" plus mult somatic complaints including transient episode of blurred vision, occipital HA, "flu-like feeling", unsteady- "about 10%" Raymond Hayes says, then tired/ exhausted==> gradually better now;  Also c/o paresthesias w/ a "cold sensation" in his feet, similar senation in his knees intermittently, not really a numbness & no prob at night in bed- just when Raymond Hayes is up & about, not a pain & no radiation, etc; this disturbance is really bothering him but I can't find any objective abn- so we discussed Neuro eval & we will refer...     We reviewed prob list, meds, xrays and labs> see below for updates >> LABS 8/13:  FLP- ok x LDL=121;  Chems- all wnl;  CBC- wnl;  TSH=1.12;  PSA= 0.42;  Sed=8...    Problem List:   ALLERGIC RHINITIS (ICD-477.9) - hx allergic rhinitis symptoms treated OTC Prn... never had allergy testing etc...  HYPERTENSION (ICD-401.9) - hx mild HBP prev controlled w/ Vasotec 5mg /d...  ~  4/11:  BP= 122/80 today & similar at home... tolerates med well & denies HA, fatigue,  visual changes, CP, palipit, dizziness, syncope, dyspnea, edema, etc...  ~  12/11:  pt called w/ incr BP at home up to 180/90 & we decided to ch to LOTREL 5/10 daily... ~  7/13:  BP= 130/84 & Raymond Hayes remains asymptomatic  HYPERCHOLESTEROLEMIA, BORDERLINE (ICD-272.4) - hx mild elevations of TChol & LDL in the past... controlled on diet Rx alone... ~  FLP 4/04 showed TChol 236, TG 64, HDL 56, LDL 167 ~  FLP 8/06 showed TChol 175, TG 43, HDL 49, LDL 118 ~  FLP 11/08 showed TChol 213, TG 39, HDL 63, LDL 136 ~  FLP 4/11 showed TChol 172, TG 56, HDL 53, LDL 107 ~  FLP 8/13 showed TChol 191, TG 61, HDL 58, LDL 121... Raymond Hayes does not want Statin meds, therefore diet rx.  COLONIC POLYPS (ICD-211.3) - Raymond Hayes had routine colonoscopy by DrGessner 6/11 at age 54 > showing 5mm polyp in rectum & bx= tub adenoma, & int hem...  HEMORRHOIDS (ICD-455.6) - Raymond Hayes has ANUSOL HC cream for Prn use...  PARESTHESIAS >> ~  8/13:  SEE ABOVE   No past surgical history on file.   Outpatient Encounter Prescriptions as of 04/09/2012  Medication Sig Dispense Refill  . amLODipine-benazepril (LOTREL) 5-10 MG per capsule Take 1 capsule  by mouth daily.  90 capsule  3    No Known Allergies   Current Medications, Allergies, Past Medical History, Past Surgical History, Family History, and Social History were reviewed in [employer] record.   Review of Systems        See HPI - all other systems neg except as noted...  The patient denies anorexia, fever, weight loss, weight gain, vision loss, decreased hearing, hoarseness, chest pain, syncope, dyspnea on exertion, peripheral edema, prolonged cough, headaches, hemoptysis, abdominal pain, melena, hematochezia, severe indigestion/heartburn, hematuria, incontinence, muscle weakness, suspicious skin lesions, transient blindness, difficulty walking, depression, unusual weight change, abnormal bleeding, enlarged lymph nodes, and angioedema.     Objective:   Physical  Exam     WD, WN, 51 y/o WM in NAD... GENERAL:  Alert & oriented; pleasant & cooperative. HEENT:  [place]/AT, EOM-wnl, PERRLA, Fundi-benign, EACs-clear, TMs-wnl, NOSE-clear, THROAT-clear & wnl. NECK:  Supple w/ full ROM; no JVD; normal carotid impulses w/o bruits; no thyromegaly or nodules palpated; no lymphadenopathy. CHEST:  Clear to P & A; without wheezes/ rales/ or rhonchi. HEART:  Regular Rhythm; without murmurs/ rubs/ or gallops. ABDOMEN:  Soft & nontender; normal bowel sounds; no organomegaly or masses detected. (RECTAL:  Neg - prostate 2+ & nontender w/o nodules; stool hematest neg) EXT: without deformities or arthritic changes; no varicose veins/ venous insuffic/ or edema. NEURO:  CN's intact; motor testing normal; sensory testing normal; gait normal & balance OK. DERM:  No lesions noted; no rash etc...  RADIOLOGY DATA:  Reviewed in the EPIC EMR & discussed w/ the patient...  LABORATORY DATA:  Reviewed in the EPIC EMR & discussed w/ the patient...   Assessment:     PARESTHESIAS>  I don't know what to make of his symptoms; Raymond Hayes is concerned & we discussed subspecialty eval by Neuro- we will refer.   HBP>  On Lotrel 5-10 w/ good control, tolerated well, continue same Rx...  Chol>  Raymond Hayes has mild elev LDL in the past but doesn't want meds; "I'm a diet guy" Raymond Hayes says; his weight is normal w/ BMI~22...  Hx Colon Polyps and Hems>  Raymond Hayes had colonoscopy 2011 by DrGessner w/ sm adenoma removed; has AnusolHC for prn use...     Plan:     Patient's Medications  New Prescriptions   No medications on file  Previous Medications   AMLODIPINE-BENAZEPRIL (LOTREL) 5-10 MG PER CAPSULE    Take 1 capsule by mouth daily.  Modified Medications   No medications on file  Discontinued Medications   No medications on file

## 2012-04-09 NOTE — Patient Instructions (Addendum)
Today we updated your med list in our EPIC system...    Continue your current medications the same...  Please return to our lab one morning this week for your FASTING blood work...  We will arrange for a NEUROLOGY consultation & we will call you w/ the date & time once this is arranged...  Call for any questions or concerns.Marland KitchenMarland KitchenMarland Kitchen

## 2012-04-11 ENCOUNTER — Other Ambulatory Visit (INDEPENDENT_AMBULATORY_CARE_PROVIDER_SITE_OTHER): Payer: BC Managed Care – PPO

## 2012-04-11 DIAGNOSIS — E785 Hyperlipidemia, unspecified: Secondary | ICD-10-CM

## 2012-04-11 DIAGNOSIS — D126 Benign neoplasm of colon, unspecified: Secondary | ICD-10-CM

## 2012-04-11 DIAGNOSIS — I10 Essential (primary) hypertension: Secondary | ICD-10-CM

## 2012-04-11 DIAGNOSIS — N139 Obstructive and reflux uropathy, unspecified: Secondary | ICD-10-CM

## 2012-04-11 DIAGNOSIS — R209 Unspecified disturbances of skin sensation: Secondary | ICD-10-CM

## 2012-04-11 DIAGNOSIS — N320 Bladder-neck obstruction: Secondary | ICD-10-CM

## 2012-04-11 DIAGNOSIS — R202 Paresthesia of skin: Secondary | ICD-10-CM

## 2012-04-11 LAB — HEPATIC FUNCTION PANEL
Alkaline Phosphatase: 69 U/L (ref 39–117)
Bilirubin, Direct: 0.2 mg/dL (ref 0.0–0.3)
Total Bilirubin: 1.7 mg/dL — ABNORMAL HIGH (ref 0.3–1.2)
Total Protein: 6.4 g/dL (ref 6.0–8.3)

## 2012-04-11 LAB — CBC WITH DIFFERENTIAL/PLATELET
Basophils Relative: 0.6 % (ref 0.0–3.0)
Eosinophils Absolute: 0.1 10*3/uL (ref 0.0–0.7)
HCT: 45.7 % (ref 39.0–52.0)
Hemoglobin: 15.2 g/dL (ref 13.0–17.0)
Lymphocytes Relative: 35.7 % (ref 12.0–46.0)
Lymphs Abs: 1.8 10*3/uL (ref 0.7–4.0)
MCHC: 33.3 g/dL (ref 30.0–36.0)
Neutro Abs: 2.8 10*3/uL (ref 1.4–7.7)
RBC: 4.95 Mil/uL (ref 4.22–5.81)
RDW: 12.1 % (ref 11.5–14.6)

## 2012-04-11 LAB — TSH: TSH: 1.12 u[IU]/mL (ref 0.35–5.50)

## 2012-04-11 LAB — LIPID PANEL
HDL: 57.7 mg/dL (ref >39.00)
LDL Cholesterol: 121 mg/dL — ABNORMAL HIGH (ref 0–99)
Total CHOL/HDL Ratio: 3
Triglycerides: 61 mg/dL (ref 0.0–149.0)
VLDL: 12.2 mg/dL (ref 0.0–40.0)

## 2012-04-11 LAB — BASIC METABOLIC PANEL
CO2: 28 mEq/L (ref 19–32)
Calcium: 9.2 mg/dL (ref 8.4–10.5)
Creatinine, Ser: 0.9 mg/dL (ref 0.4–1.5)
GFR: 90.48 mL/min (ref >60.00)
Sodium: 139 mEq/L (ref 135–145)

## 2012-04-14 ENCOUNTER — Encounter: Payer: Self-pay | Admitting: Pulmonary Disease

## 2012-07-09 ENCOUNTER — Telehealth: Payer: Self-pay | Admitting: Pulmonary Disease

## 2012-07-09 NOTE — Telephone Encounter (Signed)
ATC line busy x 3 wcb 

## 2012-07-09 NOTE — Telephone Encounter (Signed)
Called, spoke with pt.  States he was seen by [facility] Neuro for the foot pain and MRI was done.  Pt states he was told that "nothing was wrong" according to the MRI.  Pt states the pain has progressively gotten worse.  C/o his left leg being "mostly surface numb all day," lower left back being "surface numb all day," and the right leg from knee down feels the same way.  States he has an appt with the neurologist on Dec 10 but doesn't feel it is any need to keep this if they do not think anything is wrong.  Pt states he as been told that a neuro surgeon could help more than neuro and would like a referral.  Dr. Kriste Basque, pls advise.  Thank you.

## 2012-07-09 NOTE — Telephone Encounter (Signed)
Per SN---he will need to keep the appt with neurology and explain his continued symptoms----neurosurgeon will not see pt without an abnormal MRI.  thanks

## 2012-07-10 NOTE — Telephone Encounter (Signed)
LMTCBx1 on work number, Home number is still busy. Carron Curie, CMA

## 2012-07-12 NOTE — Telephone Encounter (Signed)
LMOMTCB x 1 

## 2012-07-15 NOTE — Telephone Encounter (Signed)
Per SN---this is appropriate and he will need to discuss with them his frustrations. SN can refer for a 2nd opinion to [facility] neurology or [facility] neurology---let us know after his follow up on 12-10 if he would like [place] to refer.  thanks

## 2012-07-15 NOTE — Telephone Encounter (Signed)
LMOMTCB x 1 

## 2012-07-15 NOTE — Telephone Encounter (Signed)
I spoke with the pt and he is frustrated with [facility] that is why he was asking to go to a neurosurgeon because he doesn't feel like anything is being done. He states he is to the point where he can barley walk at all on his leg. Pt wanted to know if there was another neurologists in town that he could see. He has an appt with [facility] on 07-23-12. He states he does not feel he will return after this appt,  if he goes,  because he is so upset with them. He states he is going to let them know his frustrations. Pt states he will have another MRI if that is needed to see another neurologists. Please advise if a new referral is appropriate. Carron Curie, CMA

## 2012-07-16 NOTE — Telephone Encounter (Signed)
LMOMTCB x 1 

## 2012-07-16 NOTE — Telephone Encounter (Signed)
Pt returned call.  Advised of SN's recs as stated below.  Pt would like the referral to [facility] placed today and if they can see him before 12.10.13 he would like to be seen and he will cancel the 12.10 appt with [facility].  Spoke with Leigh regarding the referral and she stated that SN wants pt to keep the 12.10.13 appt with [facility] BEFORE getting the referral.  LMOM TCB x1 for pt to inform him of this.

## 2012-07-17 NOTE — Telephone Encounter (Signed)
LMTCB

## 2012-07-17 NOTE — Telephone Encounter (Signed)
Pt advised and will keep appt with [facility]. Carron Curie, CMA

## 2012-07-26 ENCOUNTER — Telehealth: Payer: Self-pay | Admitting: Pulmonary Disease

## 2012-07-26 NOTE — Telephone Encounter (Signed)
Called and spoke with pt and he stated that he did keep his appt with the neurology and the requested that he repeat the MRI---this was done on 12-12.  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  He stated that he has an appt with neurosurgeon on Monday at [facility] neurosurgeon on [address] and he also requested a second opinion to be done at [facility].  He is still waiting on this appt at [facility].  He wanted to call back and make SN aware.  Nothing further is needed.

## 2012-07-26 NOTE — Telephone Encounter (Signed)
lmomtcb  

## 2012-07-30 ENCOUNTER — Other Ambulatory Visit: Payer: Self-pay | Admitting: Neurosurgery

## 2012-07-30 DIAGNOSIS — M5104 Intervertebral disc disorders with myelopathy, thoracic region: Secondary | ICD-10-CM

## 2012-07-31 ENCOUNTER — Other Ambulatory Visit: Payer: Self-pay | Admitting: Neurosurgery

## 2012-08-01 ENCOUNTER — Ambulatory Visit
Admission: RE | Admit: 2012-08-01 | Discharge: 2012-08-01 | Disposition: A | Discharge status: Home / Self Care | Payer: BC Managed Care – PPO | Source: Ambulatory Visit | Attending: Neurosurgery | Admitting: Neurosurgery

## 2012-08-01 DIAGNOSIS — M5104 Intervertebral disc disorders with myelopathy, thoracic region: Secondary | ICD-10-CM

## 2012-08-21 ENCOUNTER — Encounter (HOSPITAL_COMMUNITY): Payer: Self-pay | Admitting: Pharmacy Technician

## 2012-08-21 NOTE — Pre-Procedure Instructions (Addendum)
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  Report to [facility] at 11:00 noon.   Call this number if you have problems the morning of surgery: [phone]   Remember:Nothing to eat or drink after Midnight.   Take these medicines the morning of surgery with A SIP OF WATER: Amolodipine- Benazepril (Lotrel). Discontinue aspirin, Coumadin, Plavix, Effient, and herbal medications      Do not wear jewelry, make-up or nail polish.  Do not wear lotions, powders, or perfumes. You may wear deodorant.  Do not shave 48 hours prior to surgery. Men may shave face and neck.  Do not bring valuables to the hospital.  Contacts, dentures or bridgework may not be worn into surgery.  Leave suitcase in the car. After surgery it may be brought to your room.  For patients admitted to the hospital, checkout time is 11:00 AM the day of discharge.   Patients discharged the day of surgery will not be allowed to drive home.  Name and phone number of your driver: NA    Special Instructions: Shower using CHG 2 nights before surgery and the night before surgery.  If you shower the day of surgery use CHG.  Use special wash - you have one bottle of CHG for all showers.  You should use approximately 1/3 of the bottle for each shower.   Please read over the following fact sheets that you were given: Pain Booklet, Coughing and Deep Breathing and Surgical Site Infection Prevention

## 2012-08-22 ENCOUNTER — Encounter (HOSPITAL_COMMUNITY)
Admission: RE | Admit: 2012-08-22 | Discharge: 2012-08-22 | Disposition: A | Discharge status: Home / Self Care | Payer: BC Managed Care – PPO | Source: Ambulatory Visit | Attending: Anesthesiology | Admitting: Anesthesiology

## 2012-08-22 ENCOUNTER — Encounter (HOSPITAL_COMMUNITY): Payer: Self-pay

## 2012-08-22 ENCOUNTER — Encounter (HOSPITAL_COMMUNITY)
Admission: RE | Admit: 2012-08-22 | Discharge: 2012-08-22 | Disposition: A | Discharge status: Home / Self Care | Payer: BC Managed Care – PPO | Source: Ambulatory Visit | Attending: Neurosurgery | Admitting: Neurosurgery

## 2012-08-22 DIAGNOSIS — Z01812 Encounter for preprocedural laboratory examination: Secondary | ICD-10-CM | POA: Insufficient documentation

## 2012-08-22 DIAGNOSIS — Z01811 Encounter for preprocedural respiratory examination: Secondary | ICD-10-CM | POA: Insufficient documentation

## 2012-08-22 DIAGNOSIS — Z01810 Encounter for preprocedural cardiovascular examination: Secondary | ICD-10-CM | POA: Insufficient documentation

## 2012-08-22 DIAGNOSIS — Z01818 Encounter for other preprocedural examination: Secondary | ICD-10-CM | POA: Insufficient documentation

## 2012-08-22 HISTORY — DX: Headache: R51

## 2012-08-22 LAB — CBC
Hemoglobin: 15.5 g/dL (ref 13.0–17.0)
MCH: 30.8 pg (ref 26.0–34.0)
MCHC: 34.7 g/dL (ref 30.0–36.0)
Platelets: 226 10*3/uL (ref 150–400)
RDW: 11.8 % (ref 11.5–15.5)

## 2012-08-22 LAB — BASIC METABOLIC PANEL
BUN: 14 mg/dL (ref 6–23)
Calcium: 9.5 mg/dL (ref 8.4–10.5)
Creatinine, Ser: 0.81 mg/dL (ref 0.50–1.35)
GFR calc non Af Amer: >90 mL/min (ref >90)
Glucose, Bld: 85 mg/dL (ref 70–99)

## 2012-08-22 LAB — SURGICAL PCR SCREEN: MRSA, PCR: NEGATIVE

## 2012-08-22 NOTE — Progress Notes (Addendum)
Pt here for PAT appt.  Reports that his family physician is Dr. Nadel(Pulmonologist) in [place].  Denies any pulmonary, cardiac issues.  Had stress test over 17 years when going through stress. Denies sleep apnea/study.

## 2012-08-22 NOTE — Progress Notes (Signed)
Notified pt not to take Amlodipine-Benezapril DOS.  He verbalized understanding.

## 2012-08-22 NOTE — Progress Notes (Signed)
Pt refuses to sign consent.  Wishes to speak with surgeon. Will need consent signed DOS-note applied to outside of chart.

## 2012-08-28 MED ORDER — CEFAZOLIN SODIUM-DEXTROSE 2-3 GM-% IV SOLR
2.0000 g | INTRAVENOUS | Status: AC
  Administered 2012-08-29: 2 g via INTRAVENOUS
  Filled 2012-08-28: qty 50

## 2012-08-29 ENCOUNTER — Ambulatory Visit (HOSPITAL_COMMUNITY): Payer: BC Managed Care – PPO | Admitting: Anesthesiology

## 2012-08-29 ENCOUNTER — Encounter (HOSPITAL_COMMUNITY): Payer: Self-pay | Admitting: Anesthesiology

## 2012-08-29 ENCOUNTER — Ambulatory Visit (HOSPITAL_COMMUNITY): Payer: BC Managed Care – PPO

## 2012-08-29 ENCOUNTER — Encounter (HOSPITAL_COMMUNITY): Payer: Self-pay

## 2012-08-29 ENCOUNTER — Inpatient Hospital Stay (HOSPITAL_COMMUNITY)
Admission: RE | Admit: 2012-08-29 | Discharge: 2012-08-30 | DRG: 757 | Disposition: A | Discharge status: Home / Self Care | Payer: BC Managed Care – PPO | Source: Ambulatory Visit | Attending: Neurosurgery | Admitting: Neurosurgery

## 2012-08-29 ENCOUNTER — Encounter (HOSPITAL_COMMUNITY)
Admission: RE | Disposition: A | Discharge status: Home / Self Care | Payer: Self-pay | Source: Ambulatory Visit | Attending: Neurosurgery

## 2012-08-29 ENCOUNTER — Encounter (HOSPITAL_COMMUNITY): Payer: Self-pay | Admitting: *Deleted

## 2012-08-29 DIAGNOSIS — I10 Essential (primary) hypertension: Secondary | ICD-10-CM | POA: Diagnosis present

## 2012-08-29 DIAGNOSIS — Z01812 Encounter for preprocedural laboratory examination: Secondary | ICD-10-CM

## 2012-08-29 DIAGNOSIS — M4714 Other spondylosis with myelopathy, thoracic region: Secondary | ICD-10-CM | POA: Diagnosis present

## 2012-08-29 DIAGNOSIS — Z79899 Other long term (current) drug therapy: Secondary | ICD-10-CM

## 2012-08-29 DIAGNOSIS — Z01810 Encounter for preprocedural cardiovascular examination: Secondary | ICD-10-CM

## 2012-08-29 DIAGNOSIS — Z01818 Encounter for other preprocedural examination: Secondary | ICD-10-CM

## 2012-08-29 DIAGNOSIS — M5104 Intervertebral disc disorders with myelopathy, thoracic region: Principal | ICD-10-CM | POA: Diagnosis present

## 2012-08-29 HISTORY — PX: THORACIC DISCECTOMY: SHX6113

## 2012-08-29 SURGERY — THORACIC DISCECTOMY
Anesthesia: General | Site: Back | Laterality: Left | Wound class: Clean

## 2012-08-29 MED ORDER — LIDOCAINE HCL (CARDIAC) 20 MG/ML IV SOLN
INTRAVENOUS | Status: DC | PRN
  Administered 2012-08-29: 40 mg via INTRAVENOUS

## 2012-08-29 MED ORDER — HYDROCODONE-ACETAMINOPHEN 5-325 MG PO TABS
1.0000 | ORAL_TABLET | ORAL | Status: DC | PRN
  Administered 2012-08-29: 1 via ORAL
  Filled 2012-08-29: qty 1

## 2012-08-29 MED ORDER — SODIUM CHLORIDE 0.9 % IV SOLN
INTRAVENOUS | Status: AC
  Filled 2012-08-29: qty 500

## 2012-08-29 MED ORDER — METOCLOPRAMIDE HCL 5 MG/ML IJ SOLN: 10.0000 mg | Freq: Once | INTRAMUSCULAR | Status: DC | PRN

## 2012-08-29 MED ORDER — MENTHOL 3 MG MT LOZG: 1.0000 | LOZENGE | OROMUCOSAL | Status: DC | PRN

## 2012-08-29 MED ORDER — FLEET ENEMA 7-19 GM/118ML RE ENEM
1.0000 | ENEMA | Freq: Once | RECTAL | Status: AC | PRN
  Filled 2012-08-29: qty 1

## 2012-08-29 MED ORDER — 0.9 % SODIUM CHLORIDE (POUR BTL) OPTIME
TOPICAL | Status: DC | PRN
  Administered 2012-08-29: 1000 mL

## 2012-08-29 MED ORDER — ZOLPIDEM TARTRATE 5 MG PO TABS
5.0000 mg | ORAL_TABLET | Freq: Every evening | ORAL | Status: DC | PRN
  Filled 2012-08-29: qty 1

## 2012-08-29 MED ORDER — SODIUM CHLORIDE 0.9 % IV SOLN: 250.0000 mL | INTRAVENOUS | Status: DC

## 2012-08-29 MED ORDER — PROPOFOL INFUSION 10 MG/ML OPTIME
INTRAVENOUS | Status: DC | PRN
  Administered 2012-08-29: 75 ug/kg/min via INTRAVENOUS

## 2012-08-29 MED ORDER — DIAZEPAM 5 MG PO TABS: 5.0000 mg | ORAL_TABLET | Freq: Four times a day (QID) | ORAL | Status: DC | PRN

## 2012-08-29 MED ORDER — DOCUSATE SODIUM 100 MG PO CAPS
100.0000 mg | ORAL_CAPSULE | Freq: Two times a day (BID) | ORAL | Status: DC
  Administered 2012-08-29 – 2012-08-30 (×2): 100 mg via ORAL
  Filled 2012-08-29 (×3): qty 1

## 2012-08-29 MED ORDER — PHENYLEPHRINE HCL 10 MG/ML IJ SOLN
10.0000 mg | INTRAVENOUS | Status: DC | PRN
  Administered 2012-08-29: 10 ug/min via INTRAVENOUS

## 2012-08-29 MED ORDER — ALUM & MAG HYDROXIDE-SIMETH 200-200-20 MG/5ML PO SUSP: 30.0000 mL | Freq: Four times a day (QID) | ORAL | Status: DC | PRN

## 2012-08-29 MED ORDER — AMLODIPINE BESY-BENAZEPRIL HCL 5-10 MG PO CAPS: 1.0000 | ORAL_CAPSULE | Freq: Every day | ORAL | Status: DC

## 2012-08-29 MED ORDER — ACETAMINOPHEN 650 MG RE SUPP: 650.0000 mg | RECTAL | Status: DC | PRN

## 2012-08-29 MED ORDER — SENNA 8.6 MG PO TABS
1.0000 | ORAL_TABLET | Freq: Two times a day (BID) | ORAL | Status: DC
  Administered 2012-08-29 – 2012-08-30 (×2): 8.6 mg via ORAL
  Filled 2012-08-29 (×3): qty 1

## 2012-08-29 MED ORDER — SENNOSIDES-DOCUSATE SODIUM 8.6-50 MG PO TABS
1.0000 | ORAL_TABLET | Freq: Every evening | ORAL | Status: DC | PRN
  Filled 2012-08-29: qty 1

## 2012-08-29 MED ORDER — ACETAMINOPHEN 325 MG PO TABS: 650.0000 mg | ORAL_TABLET | ORAL | Status: DC | PRN

## 2012-08-29 MED ORDER — KCL IN DEXTROSE-NACL 20-5-0.45 MEQ/L-%-% IV SOLN
INTRAVENOUS | Status: DC
  Administered 2012-08-29: 20:00:00 via INTRAVENOUS
  Filled 2012-08-29 (×3): qty 1000

## 2012-08-29 MED ORDER — AMLODIPINE BESYLATE 5 MG PO TABS
5.0000 mg | ORAL_TABLET | Freq: Every day | ORAL | Status: DC
  Administered 2012-08-30: 5 mg via ORAL
  Filled 2012-08-29: qty 1

## 2012-08-29 MED ORDER — OXYCODONE HCL 5 MG PO TABS: 5.0000 mg | ORAL_TABLET | Freq: Once | ORAL | Status: DC | PRN

## 2012-08-29 MED ORDER — ONDANSETRON HCL 4 MG/2ML IJ SOLN: 4.0000 mg | INTRAMUSCULAR | Status: DC | PRN

## 2012-08-29 MED ORDER — PHENOL 1.4 % MT LIQD: 1.0000 | OROMUCOSAL | Status: DC | PRN

## 2012-08-29 MED ORDER — LACTATED RINGERS IV SOLN
INTRAVENOUS | Status: DC | PRN
  Administered 2012-08-29 (×2): via INTRAVENOUS

## 2012-08-29 MED ORDER — PANTOPRAZOLE SODIUM 40 MG IV SOLR
40.0000 mg | Freq: Every day | INTRAVENOUS | Status: DC
  Administered 2012-08-29: 40 mg via INTRAVENOUS
  Filled 2012-08-29 (×2): qty 40

## 2012-08-29 MED ORDER — MORPHINE SULFATE 2 MG/ML IJ SOLN: 1.0000 mg | INTRAMUSCULAR | Status: DC | PRN

## 2012-08-29 MED ORDER — BISACODYL 10 MG RE SUPP: 10.0000 mg | Freq: Every day | RECTAL | Status: DC | PRN

## 2012-08-29 MED ORDER — PROPOFOL 10 MG/ML IV BOLUS
INTRAVENOUS | Status: DC | PRN
  Administered 2012-08-29: 200 mg via INTRAVENOUS

## 2012-08-29 MED ORDER — OXYCODONE HCL 5 MG/5ML PO SOLN: 5.0000 mg | Freq: Once | ORAL | Status: DC | PRN

## 2012-08-29 MED ORDER — LIDOCAINE-EPINEPHRINE 1 %-1:100000 IJ SOLN
INTRAMUSCULAR | Status: DC | PRN
  Administered 2012-08-29: 5 mL

## 2012-08-29 MED ORDER — HYDROMORPHONE HCL PF 1 MG/ML IJ SOLN: 0.2500 mg | INTRAMUSCULAR | Status: DC | PRN

## 2012-08-29 MED ORDER — DEXAMETHASONE SODIUM PHOSPHATE 4 MG/ML IJ SOLN
4.0000 mg | Freq: Four times a day (QID) | INTRAMUSCULAR | Status: DC
  Administered 2012-08-29 – 2012-08-30 (×3): 4 mg via INTRAVENOUS
  Filled 2012-08-29 (×9): qty 1

## 2012-08-29 MED ORDER — SUFENTANIL CITRATE 50 MCG/ML IV SOLN
INTRAVENOUS | Status: DC | PRN
  Administered 2012-08-29: 5 ug via INTRAVENOUS
  Administered 2012-08-29: 10 ug via INTRAVENOUS
  Administered 2012-08-29: 15 ug via INTRAVENOUS
  Administered 2012-08-29: 5 ug via INTRAVENOUS

## 2012-08-29 MED ORDER — BENAZEPRIL HCL 10 MG PO TABS
10.0000 mg | ORAL_TABLET | Freq: Every day | ORAL | Status: DC
  Administered 2012-08-30: 10 mg via ORAL
  Filled 2012-08-29: qty 1

## 2012-08-29 MED ORDER — BUPIVACAINE HCL (PF) 0.5 % IJ SOLN
INTRAMUSCULAR | Status: DC | PRN
  Administered 2012-08-29: 5 mL

## 2012-08-29 MED ORDER — SODIUM CHLORIDE 0.9 % IJ SOLN: 3.0000 mL | INTRAMUSCULAR | Status: DC | PRN

## 2012-08-29 MED ORDER — LIDOCAINE HCL 4 % MT SOLN
OROMUCOSAL | Status: DC | PRN
  Administered 2012-08-29: 4 mL via TOPICAL

## 2012-08-29 MED ORDER — MIDAZOLAM HCL 5 MG/5ML IJ SOLN
INTRAMUSCULAR | Status: DC | PRN
  Administered 2012-08-29: 2 mg via INTRAVENOUS

## 2012-08-29 MED ORDER — ONDANSETRON HCL 4 MG/2ML IJ SOLN
INTRAMUSCULAR | Status: DC | PRN
  Administered 2012-08-29: 4 mg via INTRAVENOUS

## 2012-08-29 MED ORDER — DEXAMETHASONE SODIUM PHOSPHATE 4 MG/ML IJ SOLN
INTRAMUSCULAR | Status: DC | PRN
  Administered 2012-08-29: 20 mg via INTRAVENOUS

## 2012-08-29 MED ORDER — SODIUM CHLORIDE 0.9 % IJ SOLN
3.0000 mL | Freq: Two times a day (BID) | INTRAMUSCULAR | Status: DC
  Administered 2012-08-30: 3 mL via INTRAVENOUS

## 2012-08-29 MED ORDER — SODIUM CHLORIDE 0.9 % IR SOLN
Status: DC | PRN
  Administered 2012-08-29: 17:00:00

## 2012-08-29 MED ORDER — BACITRACIN 50000 UNITS IM SOLR
INTRAMUSCULAR | Status: AC
  Filled 2012-08-29: qty 1

## 2012-08-29 MED ORDER — THROMBIN 20000 UNITS EX SOLR
CUTANEOUS | Status: DC | PRN
  Administered 2012-08-29: 17:00:00 via TOPICAL

## 2012-08-29 MED ORDER — OXYCODONE-ACETAMINOPHEN 5-325 MG PO TABS
1.0000 | ORAL_TABLET | ORAL | Status: DC | PRN
  Administered 2012-08-30: 1 via ORAL
  Filled 2012-08-29: qty 2

## 2012-08-29 MED ORDER — SUCCINYLCHOLINE CHLORIDE 20 MG/ML IJ SOLN
INTRAMUSCULAR | Status: DC | PRN
  Administered 2012-08-29: 120 mg via INTRAVENOUS

## 2012-08-29 MED ORDER — CEFAZOLIN SODIUM 1-5 GM-% IV SOLN
1.0000 g | Freq: Three times a day (TID) | INTRAVENOUS | Status: AC
  Administered 2012-08-29 – 2012-08-30 (×2): 1 g via INTRAVENOUS
  Filled 2012-08-29 (×2): qty 50

## 2012-08-29 SURGICAL SUPPLY — 64 items
ADH SKN CLS APL DERMABOND .7 (GAUZE/BANDAGES/DRESSINGS)
ADH SKN CLS LQ APL DERMABOND (GAUZE/BANDAGES/DRESSINGS) ×1
APL SKNCLS STERI-STRIP NONHPOA (GAUZE/BANDAGES/DRESSINGS)
BAG DECANTER FOR FLEXI CONT (MISCELLANEOUS) ×2 IMPLANT
BENZOIN TINCTURE PRP APPL 2/3 (GAUZE/BANDAGES/DRESSINGS) IMPLANT
BIT DRILL NEURO 2X3.1 SFT TUCH (MISCELLANEOUS) ×1 IMPLANT
BLADE SURG ROTATE 9660 (MISCELLANEOUS) IMPLANT
BUR ROUND FLUTED 5 RND (BURR) ×2 IMPLANT
CANISTER SUCTION 2500CC (MISCELLANEOUS) ×2 IMPLANT
CLOTH BEACON ORANGE TIMEOUT ST (SAFETY) ×2 IMPLANT
CONT SPEC 4OZ CLIKSEAL STRL BL (MISCELLANEOUS) ×1 IMPLANT
DERMABOND ADHESIVE PROPEN (GAUZE/BANDAGES/DRESSINGS) ×1
DERMABOND ADVANCED (GAUZE/BANDAGES/DRESSINGS)
DERMABOND ADVANCED .7 DNX12 (GAUZE/BANDAGES/DRESSINGS) ×1 IMPLANT
DERMABOND ADVANCED .7 DNX6 (GAUZE/BANDAGES/DRESSINGS) IMPLANT
DRAPE LAPAROTOMY 100X72X124 (DRAPES) ×2 IMPLANT
DRAPE MICROSCOPE LEICA (MISCELLANEOUS) ×2 IMPLANT
DRAPE POUCH INSTRU U-SHP 10X18 (DRAPES) ×2 IMPLANT
DRESSING TELFA 8X3 (GAUZE/BANDAGES/DRESSINGS) IMPLANT
DRILL NEURO 2X3.1 SOFT TOUCH (MISCELLANEOUS) ×2
DURAPREP 26ML APPLICATOR (WOUND CARE) ×2 IMPLANT
ELECT REM PT RETURN 9FT ADLT (ELECTROSURGICAL) ×2
ELECTRODE REM PT RTRN 9FT ADLT (ELECTROSURGICAL) ×1 IMPLANT
GAUZE SPONGE 4X4 16PLY XRAY LF (GAUZE/BANDAGES/DRESSINGS) IMPLANT
GLOVE BIO SURGEON STRL SZ 6.5 (GLOVE) ×1 IMPLANT
GLOVE BIO SURGEON STRL SZ8 (GLOVE) ×2 IMPLANT
GLOVE BIOGEL PI IND STRL 6.5 (GLOVE) IMPLANT
GLOVE BIOGEL PI IND STRL 7.0 (GLOVE) IMPLANT
GLOVE BIOGEL PI IND STRL 8 (GLOVE) ×1 IMPLANT
GLOVE BIOGEL PI IND STRL 8.5 (GLOVE) ×1 IMPLANT
GLOVE BIOGEL PI INDICATOR 6.5 (GLOVE) ×1
GLOVE BIOGEL PI INDICATOR 7.0 (GLOVE) ×1
GLOVE BIOGEL PI INDICATOR 8 (GLOVE) ×2
GLOVE BIOGEL PI INDICATOR 8.5 (GLOVE) ×1
GLOVE ECLIPSE 7.5 STRL STRAW (GLOVE) ×4 IMPLANT
GLOVE ECLIPSE 8.0 STRL XLNG CF (GLOVE) ×1 IMPLANT
GLOVE EXAM NITRILE LRG STRL (GLOVE) IMPLANT
GLOVE EXAM NITRILE MD LF STRL (GLOVE) IMPLANT
GLOVE EXAM NITRILE XL STR (GLOVE) IMPLANT
GLOVE EXAM NITRILE XS STR PU (GLOVE) IMPLANT
GLOVE SURG SS PI 6.5 STRL IVOR (GLOVE) ×3 IMPLANT
GOWN BRE IMP SLV AUR LG STRL (GOWN DISPOSABLE) ×3 IMPLANT
GOWN BRE IMP SLV AUR XL STRL (GOWN DISPOSABLE) ×2 IMPLANT
GOWN STRL REIN 2XL LVL4 (GOWN DISPOSABLE) ×2 IMPLANT
KIT BASIN OR (CUSTOM PROCEDURE TRAY) ×2 IMPLANT
KIT ROOM TURNOVER OR (KITS) ×2 IMPLANT
NDL HYPO 25X1 1.5 SAFETY (NEEDLE) ×1 IMPLANT
NEEDLE HYPO 25X1 1.5 SAFETY (NEEDLE) ×2 IMPLANT
NS IRRIG 1000ML POUR BTL (IV SOLUTION) ×2 IMPLANT
PACK LAMINECTOMY NEURO (CUSTOM PROCEDURE TRAY) ×2 IMPLANT
PAD ARMBOARD 7.5X6 YLW CONV (MISCELLANEOUS) ×6 IMPLANT
SPONGE GAUZE 4X4 12PLY (GAUZE/BANDAGES/DRESSINGS) IMPLANT
SPONGE SURGIFOAM ABS GEL 100 (HEMOSTASIS) ×1 IMPLANT
SPONGE SURGIFOAM ABS GEL SZ50 (HEMOSTASIS) IMPLANT
STAPLER SKIN PROX WIDE 3.9 (STAPLE) IMPLANT
STRIP CLOSURE SKIN 1/2X4 (GAUZE/BANDAGES/DRESSINGS) IMPLANT
SUT VIC AB 0 CT1 18XCR BRD8 (SUTURE) ×1 IMPLANT
SUT VIC AB 0 CT1 8-18 (SUTURE) ×2
SUT VIC AB 2-0 CT1 18 (SUTURE) ×2 IMPLANT
SUT VIC AB 3-0 SH 8-18 (SUTURE) ×2 IMPLANT
SYR 20CC LL (SYRINGE) ×2 IMPLANT
TOWEL OR 17X24 6PK STRL BLUE (TOWEL DISPOSABLE) ×2 IMPLANT
TOWEL OR 17X26 10 PK STRL BLUE (TOWEL DISPOSABLE) ×2 IMPLANT
WATER STERILE IRR 1000ML POUR (IV SOLUTION) ×2 IMPLANT

## 2012-08-29 NOTE — Anesthesia Postprocedure Evaluation (Signed)
  Anesthesia Post-op Note  Patient: Raymond Hayes  Procedure(s) Performed: Procedure(s) (LRB) with comments: THORACIC DISCECTOMY (Left) - Left Thoracic seven-eight Transpedicular Diskectomy with Spinal cord monitering  Patient Location: PACU  Anesthesia Type:General  Level of Consciousness: awake and alert   Airway and Oxygen Therapy: Patient Spontanous Breathing  Post-op Pain: mild  Post-op Assessment: Post-op Vital signs reviewed, Patient's Cardiovascular Status Stable, Respiratory Function Stable, Patent Airway and No signs of Nausea or vomiting  Post-op Vital Signs: Reviewed and stable  Complications: No apparent anesthesia complications

## 2012-08-29 NOTE — Anesthesia Preprocedure Evaluation (Signed)
Anesthesia Evaluation  Patient identified by MRN, date of birth, ID band Patient awake    Reviewed: Allergy & Precautions, H&P , NPO status , Patient's Chart, lab work & pertinent test results, reviewed documented beta blocker date and time   Airway Mallampati: II TM Distance: >3 FB Neck ROM: full    Dental   Pulmonary neg pulmonary ROS,  breath sounds clear to auscultation        Cardiovascular hypertension, On Medications Rhythm:regular     Neuro/Psych  Headaches, negative psych ROS   GI/Hepatic negative GI ROS, Neg liver ROS,   Endo/Other  negative endocrine ROS  Renal/GU negative Renal ROS  negative genitourinary   Musculoskeletal   Abdominal   Peds  Hematology negative hematology ROS (+)   Anesthesia Other Findings See surgeon's H&P   Reproductive/Obstetrics negative OB ROS                           Anesthesia Physical Anesthesia Plan  ASA: II  Anesthesia Plan: General   Post-op Pain Management:    Induction: Intravenous  Airway Management Planned: Oral ETT  Additional Equipment:   Intra-op Plan:   Post-operative Plan: Extubation in OR  Informed Consent: I have reviewed the patients History and Physical, chart, labs and discussed the procedure including the risks, benefits and alternatives for the proposed anesthesia with the patient or authorized representative who has indicated his/her understanding and acceptance.   Dental Advisory Given  Plan Discussed with: CRNA and Surgeon  Anesthesia Plan Comments:         Anesthesia Quick Evaluation

## 2012-08-29 NOTE — Brief Op Note (Signed)
08/29/2012  5:51 PM  PATIENT:  Raymond Hayes  51 y.o. 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  PROCEDURE:  Procedure(s) (LRB) with comments: THORACIC DISCECTOMY (Left) - Left Thoracic seven-eight Transpedicular Diskectomy with Spinal cord monitering with microdissection  SURGEON:  Surgeon(s) and Role:    * Mariela Rex, MD - Primary  PHYSICIAN ASSISTANT: Hirsch, MD  ASSISTANTS: Poteat, RN   ANESTHESIA:   general  EBL:  Total I/O In: 1650 [I.V.:1650] Out: 75 [Blood:75]  BLOOD ADMINISTERED:none  DRAINS: none   LOCAL MEDICATIONS USED:  LIDOCAINE   SPECIMEN:  No Specimen and Excision  DISPOSITION OF SPECIMEN:  PATHOLOGY  COUNTS:  YES  TOURNIQUET:  * No tourniquets in log *  DICTATION: Patient has spinal cord compression at T78 due to a large calcified disc herniation with leg weakness and numbness and intractable pain. It was elected to take him to surgery for T78 laminectomy and resection of disc herniation with spinal cord monitoring.  Procedure: Patient was brought to the operating room and following the smooth and uncomplicated induction of general endotracheal anesthesia he was placed in a prone position on the Wilson frame. 3, 18-gauge spinal needles were taped to his back and an AP radiograph was obtained to localize the T8 level. His back was prepped and draped in the usual sterile fashion with DuraPrep. Area of planned incision was infiltrated with local lidocaine. Incision was made in the midline and carried to the lumbodorsal fascia which was incised on the left of midline. A second radiograph was obtained with  marker at the T 78 intralaminar space.The high speed drill was used to create a laminectomy defect of T7 and T8 and transpedicular removal of the left T8 pedicle was also performed. A large disc herniation was identified and the  and the spinal cord dura was defined and this appeared to be displaced to the right with significant cord compression. The disc capsule was incised with a 15 blade and a large amount of herniated disc material was removed with resultant decompression of the spinal cord.  This was done with a variety of microinstruments under the operating microscope.  A calcified portion of the disc herniation was peeled away from the spinal cord dura which was widely decompressed.  At this point it was felt that all neural elements were well decompressed and we felt we had removed the vast majority of disc material compressing the spinal cord and certainly all the material that we felt we could safely remove . The wound was then irrigated with bacitracin saline. Hemostasis was assured with bipolar electrocautery and gelfoam. The lumbodorsal fascia was closed with 0 Vicryl sutures the subcutaneous tissues reapproximated 2-0 Vicryl inverted sutures and the skin edges were reapproximated with 3-0 Vicryl subcuticular stitch. The wound is dressed with demabond. Patient was extubated in the operating room and taken to recovery in stable and satisfactory condition having tolerated her operation well counts were correct at the end of the case.   PLAN OF CARE: Admit to inpatient   PATIENT DISPOSITION:  PACU - hemodynamically stable.   Delay start of Pharmacological VTE agent (>24hrs) due to surgical blood loss or risk of bleeding: yes  
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
## 2012-08-29 NOTE — Progress Notes (Signed)
Awake, sleepy, but arousable.  MAEW.  Full power right leg and foot, mild weakness left foot with good proximal left leg strength.  Doing well.

## 2012-08-29 NOTE — Op Note (Signed)
08/29/2012  5:51 PM  PATIENT:  Raymond Hayes  52 y.o. 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  PROCEDURE:  Procedure(s) (LRB) with comments: THORACIC DISCECTOMY (Left) - Left Thoracic seven-eight Transpedicular Diskectomy with Spinal cord monitering with microdissection  SURGEON:  Surgeon(s) and Role:    * Maeola Harman, MD - Primary  PHYSICIAN ASSISTANT: Phoebe Perch, MD  ASSISTANTS: Poteat, RN   ANESTHESIA:   general  EBL:  Total I/O In: 1650 [I.V.:1650] Out: 75 [Blood:75]  BLOOD ADMINISTERED:none  DRAINS: none   LOCAL MEDICATIONS USED:  LIDOCAINE   SPECIMEN:  No Specimen and Excision  DISPOSITION OF SPECIMEN:  PATHOLOGY  COUNTS:  YES  TOURNIQUET:  * No tourniquets in log *  DICTATION: Patient has spinal cord compression at T78 due to a large calcified disc herniation with leg weakness and numbness and intractable pain. It was elected to take him to surgery for T78 laminectomy and resection of disc herniation with spinal cord monitoring.  Procedure: Patient was brought to the operating room and following the smooth and uncomplicated induction of general endotracheal anesthesia he was placed in a prone position on the Wilson frame. 3, 18-gauge spinal needles were taped to his back and an AP radiograph was obtained to localize the T8 level. His back was prepped and draped in the usual sterile fashion with DuraPrep. Area of planned incision was infiltrated with local lidocaine. Incision was made in the midline and carried to the lumbodorsal fascia which was incised on the left of midline. A second radiograph was obtained with  marker at the T 78 intralaminar space.The high speed drill was used to create a laminectomy defect of T7 and T8 and transpedicular removal of the left T8 pedicle was also performed. A large disc herniation was identified and the  and the spinal cord dura was defined and this appeared to be displaced to the right with significant cord compression. The disc capsule was incised with a 15 blade and a large amount of herniated disc material was removed with resultant decompression of the spinal cord.  This was done with a variety of microinstruments under the operating microscope.  A calcified portion of the disc herniation was peeled away from the spinal cord dura which was widely decompressed.  At this point it was felt that all neural elements were well decompressed and we felt we had removed the vast majority of disc material compressing the spinal cord and certainly all the material that we felt we could safely remove . The wound was then irrigated with bacitracin saline. Hemostasis was assured with bipolar electrocautery and gelfoam. The lumbodorsal fascia was closed with 0 Vicryl sutures the subcutaneous tissues reapproximated 2-0 Vicryl inverted sutures and the skin edges were reapproximated with 3-0 Vicryl subcuticular stitch. The wound is dressed with demabond. Patient was extubated in the operating room and taken to recovery in stable and satisfactory condition having tolerated her operation well counts were correct at the end of the case.   PLAN OF CARE: Admit to inpatient   PATIENT DISPOSITION:  PACU - hemodynamically stable.   Delay start of Pharmacological VTE agent (>24hrs) due to surgical blood loss or risk of bleeding: yes

## 2012-08-29 NOTE — Progress Notes (Signed)
On repeat exam, patient now has full bilateral lower extremity strength in all groups.  He is awake, alert, conversant.  He denies numbness.  He is doing well.

## 2012-08-29 NOTE — Transfer of Care (Signed)
Immediate Anesthesia Transfer of Care Note  Patient: Raymond Hayes  Procedure(s) Performed: Procedure(s) (LRB) with comments: THORACIC DISCECTOMY (Left) - Left Thoracic seven-eight Transpedicular Diskectomy with Spinal cord monitering  Patient Location: PACU  Anesthesia Type:General  Level of Consciousness: awake, alert  and oriented  Airway & Oxygen Therapy: Patient Spontanous Breathing and Patient connected to face mask oxygen  Post-op Assessment: Report given to PACU RN and Post -op Vital signs reviewed and stable  Post vital signs: Reviewed and stable  Complications: No apparent anesthesia complications

## 2012-08-30 ENCOUNTER — Encounter (HOSPITAL_COMMUNITY): Payer: Self-pay | Admitting: Neurosurgery

## 2012-08-30 MED ORDER — MANAGING BACK PAIN BOOK
Freq: Once | Status: DC
  Filled 2012-08-30: qty 1

## 2012-08-30 MED ORDER — PANTOPRAZOLE SODIUM 40 MG PO TBEC: 40.0000 mg | DELAYED_RELEASE_TABLET | Freq: Every day | ORAL | Status: DC

## 2012-08-30 NOTE — Progress Notes (Signed)
Subjective: Patient reports "I still have some burning in my feet."  Objective: Vital signs in last 24 hours: Temp:  [97.8 F (36.6 C)-98.7 F (37.1 C)] 98.1 F (36.7 C) (01/17 0836) Pulse Rate:  [7-96] 96  (01/17 0900) Resp:  [10-23] 23  (01/17 0900) BP: (95-147)/(1-84) 99/64 mmHg (01/17 0800) SpO2:  [96 %-100 %] 99 % (01/17 0800) Weight:  [65.403 kg (144 lb 3 oz)-66.6 kg (146 lb 13.2 oz)] 66.6 kg (146 lb 13.2 oz) (01/16 1937)  Intake/Output from previous day: 01/16 0701 - 01/17 0700 In: 3856.3 [P.O.:1280; I.V.:2476.3; IV Piggyback:100] Out: 1935 [Urine:1860; Blood:75] Intake/Output this shift: Total I/O In: 315 [P.O.:240; I.V.:75] Out: 525 [Urine:525]  Alert, conversant. Wife present. Good strength BLE. Incision with Dermabond. No erythema, swelling, or drainage.   Lab Results: No results found for this basename: WBC:2,HGB:2,HCT:2,PLT:2 in the last 72 hours BMET No results found for this basename: NA:2,K:2,CL:2,CO2:2,GLUCOSE:2,BUN:2,CREATININE:2,CALCIUM:2 in the last 72 hours  Studies/Results: Dg Thoracic Spine 2 View  08/29/2012  *RADIOLOGY REPORT*  Clinical Data: Transpedicular discectomy  THORACIC SPINE - 2 VIEW  Comparison: CT 08/01/2012  Findings: Two intraoperative images are submitted.  The first demonstrates metallic markers overlying T9 and T11 vertebral bodies.  Second image show surgical instruments projecting at the T7-8 interspace.  IMPRESSION:  Intraoperative localization   Original Report Authenticated By: D. Andria Rhein, MD     Assessment/Plan: Improving day 1 post-op.  LOS: 1 day  Reassured re: hopeful decrease in tingling/burning sensations over time (months). Ambulate today with expected d/c to home this afternoon. Pt to call office for f/u appt with Dr. Venetia Maxon in 3-4 weeks.   Georgiann Cocker 08/30/2012, 10:26 AM

## 2012-08-30 NOTE — Progress Notes (Signed)
Doing well. Discharge home.

## 2012-08-30 NOTE — Evaluation (Signed)
Physical Therapy Evaluation Patient Details Name: Raymond Hayes MRN: [id number] DOB: September 18, 1960 Today's Date: 08/30/2012 Time: 4098-1191 PT Time Calculation (min): 55 min  PT Assessment / Plan / Recommendation Clinical Impression  52 yo adm for T7-8 discectomy due to disc herniation with cord compression causing neuro changes in lower trunk and legs. Pt progressing very well and able to d/c home from a PT perspective.     PT Assessment  Patent does not need any further PT services    Follow Up Recommendations  No PT follow up                Equipment Recommendations  None recommended by PT               Precautions / Restrictions Precautions Precautions: Back Precaution Booklet Issued: Yes (comment) Precaution Comments: handout provided   Pertinent Vitals/Pain 1-2/10 at incision with ambulation      Mobility  Bed Mobility Bed Mobility: Rolling Right;Right Sidelying to Sit;Sitting - Scoot to [employer] of Bed;Sit to Sidelying Right Rolling Right: 5: Supervision Right Sidelying to Sit: 5: Supervision;HOB flat Sitting - Scoot to Edge of Bed: 5: Supervision Sit to Sidelying Right: 5: Supervision;HOB flat Details for Bed Mobility Assistance: vc for techniques to maintain back precautions and limit pain; repeated all aspects x 2 and pt continued to require cues (wife able to provide) Transfers Transfers: Sit to Stand;Stand to Sit Sit to Stand: 5: Supervision;With upper extremity assist;From chair/3-in-1;From bed Stand to Sit: 5: Supervision;With upper extremity assist;To bed;To chair/3-in-1 Details for Transfer Assistance: able to stand with hands on his knees Ambulation/Gait Ambulation/Gait Assistance: 4: Min guard;5: Supervision Ambulation Distance (Feet): 350 Feet Assistive device: None Ambulation/Gait Assistance Details: slow, steady gait with slight stagger step x 1 with pt correctly stopping and then proceeding; not antalgic and does not want to use an assistive  device Gait Pattern: Within Functional Limits Gait velocity: decr Stairs: Yes Stairs Assistance: 4: Min guard Stair Management Technique: One rail Right;Alternating pattern;Step to pattern;Forwards Number of Stairs: 10           Pt with specific questions re: how to apply his back precautions to everyday activities, when he can advance his activity, car transfers (demonstrated technique for wife/pt), anatomy of his injury and repair and how this correlates to his restrictions. Reviewed best positioning and type of chairs to sit in at home. All questions answered or deferred to Dr. Venetia Maxon         Visit Information  Last PT Received On: 08/30/12 Assistance Needed: +1 PT/OT Co-Evaluation/Treatment: Yes    Subjective Data  Subjective: Pt with multiple questions re: activity restrictions and how to progress his acitivities Patient Stated Goal: go to his son's basketball game this weekend (or next weekend)   Prior Functioning  Home Living Lives With: Spouse Available Help at Discharge: Family Type of Home: House Home Access: Stairs to enter Secretary/administrator of Steps: 2 Entrance Stairs-Rails: None Home Layout: Two level;Bed/bath upstairs Alternate Level Stairs-Number of Steps: 16 Alternate Level Stairs-Rails: Right Bathroom Shower/Tub: Walk-in shower;Door [employer] Toilet: Standard Bathroom Accessibility: Yes How Accessible: Accessible via walker Home Adaptive Equipment: Straight cane Prior Function Level of Independence: Independent Able to Take Stairs?: Yes Driving: Yes Vocation: Full time employment Comments: sitting for work Musician: No difficulties Dominant Hand: Right    Cognition  Overall Cognitive Status: Appears within functional limits for tasks assessed/performed Arousal/Alertness: Awake/alert Orientation Level: Appears intact for tasks assessed Behavior During Session: [facility] for tasks performed  Extremity/Trunk Assessment Right  Lower Extremity Assessment RLE ROM/Strength/Tone: WFL for tasks assessed RLE Sensation: Deficits RLE Sensation Deficits: describes feeling of "when your foot goes to sleep and begins to wake up" RLE Coordination: WFL - gross motor Left Lower Extremity Assessment LLE ROM/Strength/Tone: WFL for tasks assessed LLE Sensation: Deficits LLE Sensation Deficits: less affected than RLE LLE Coordination: WFL - gross motor Trunk Assessment Trunk Assessment: Normal       End of Session PT - End of Session Equipment Utilized During Treatment: Gait belt Activity Tolerance: Patient tolerated treatment well Patient left: in chair;with call bell/phone within reach;with family/visitor present Nurse Communication: Mobility status;Other (comment) (pt needs "sex and back pain booklet" ordered (? from pharmac)  GP     Raymond Hayes 08/30/2012, 12:18 PM  Pager [phone]

## 2012-08-30 NOTE — Discharge Summary (Signed)
Physician Discharge Summary  Patient ID: Raymond Hayes MRN: [id number] DOB/AGE: Oct 01, 1960 52 y.o.  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  Following uncomplicated THORACIC DISCECTOMY (Left) - Left Thoracic seven-eight Transpedicular Diskectomy with Spinal cord monitering with microdissection; he recovered well in Neuro PACU before transferring to Neuro ICU for overnight observation and therapy. He is ambulating well.       Consults: None  Significant Diagnostic Studies: radiology: X-Ray: intra-operative; & spinal cord monitoring  Treatments: surgery: THORACIC DISCECTOMY (Left) - Left Thoracic seven-eight Transpedicular Diskectomy with Spinal cord monitering with microdissection   Discharge Exam: Blood pressure 99/64, pulse 96, temperature 98.1 F (36.7 C), temperature source Oral, resp. rate 23, height 5\' 8"  (1.727 m), weight 66.6 kg (146 lb 13.2 oz), SpO2 99.00%. Alert, conversant. Wife present. Good strength BLE. Incision with Dermabond. No erythema, swelling, or drainage.    Disposition: Discharge to home. Pt understands d/c  instructions. Pt will call to schedule 3-4 week f/u appt with Dr. Venetia Maxon. Rx's given: Percocet 5/325, Robaxin 500mg .   Discharge Orders    Future Appointments: Provider: Department: Dept Phone: Center:   02/04/2013 9:00 AM Michele Mcalpine, MD [facility] [phone] None       Medication List     As of 08/30/2012 12:28 PM    ASK your doctor about these medications         amLODipine-benazepril 5-10 MG per capsule   Commonly known as: LOTREL   Take 1 capsule by mouth daily.      aspirin EC 81 MG tablet   Take 81 mg by mouth daily.      multivitamin tablet   Take 1 tablet by mouth daily.      VITAMIN C PO   Take 1 tablet by mouth daily.         Signed: Georgiann Cocker 08/30/2012, 12:28 PM

## 2012-08-30 NOTE — Progress Notes (Signed)
Utilization review completed.  P.J. Amiee Wiley,RN,BSN Case Manager [phone]  

## 2012-08-30 NOTE — Evaluation (Signed)
Occupational Therapy Evaluation Patient Details Name: Raymond Hayes MRN: [id number] DOB: 07-05-1961 Today's Date: 08/30/2012 Time: 1010-1100 OT Time Calculation (min): 50 min  OT Assessment / Plan / Recommendation Clinical Impression  52 yo male admitted for T 7-8 discectomy due to disc herniation with cord compression causing neuro changes in lower trunk and legs. No acute needs. Ot to sign off     OT Assessment  Patient does not need any further OT services    Follow Up Recommendations  No OT follow up    Barriers to Discharge      Equipment Recommendations  None recommended by OT    Recommendations for Other Services    Frequency       Precautions / Restrictions Precautions Precautions: Back Precaution Booklet Issued: Yes (comment) Precaution Comments: handout provided   Pertinent Vitals/Pain 1 out 10 maybe  When I am laying down none    ADL  Eating/Feeding: Independent Where Assessed - Eating/Feeding: Chair Grooming: Wash/dry face;Wash/dry hands;Brushing hair;Independent Where Assessed - Grooming: Unsupported sitting (demonstrates with ROM UE) Lower Body Dressing: Modified independent Where Assessed - Lower Body Dressing: Unsupported sit to stand (able to cross bil LE ) Toilet Transfer: Independent Toilet Transfer Method: Sit to Barista: Regular height toilet Toileting - Clothing Manipulation and Hygiene: Independent Where Assessed - Engineer, mining and Hygiene: Sit to stand from 3-in-1 or toilet Tub/Shower Transfer: Supervision/safety Tub/Shower Transfer Method: Science writer: Walk in shower (first few attempts with family present) Equipment Used: Gait belt Transfers/Ambulation Related to ADLs: Pt ambulating with unsteady gait and self corrected. Pt recognized balanace deficits and verbalized. ADL Comments: Pt with specific questions re: how to apply his back precautions to everyday activities,  when he can advance his activity, car transfers (demonstrated technique for wife/pt), anatomy of his injury and repair and how this correlates to his restrictions. Reviewed best positioning and type of chairs to sit in at home. All questions answered or deferred to Dr. Venetia Maxon .     OT Diagnosis:    OT Problem List:   OT Treatment Interventions:     OT Goals    Visit Information  Last OT Received On: 07/30/13 Assistance Needed: +1 PT/OT Co-Evaluation/Treatment: Yes    Subjective Data  Subjective: "i am up to please" Patient Stated Goal: to go home- patient wants wife to feel comfortable with leaving   Prior Functioning     Home Living Lives With: Spouse Available Help at Discharge: Family Type of Home: House Home Access: Stairs to enter Secretary/administrator of Steps: 2 Entrance Stairs-Rails: None Home Layout: Two level;Bed/bath upstairs Alternate Level Stairs-Number of Steps: 16 Alternate Level Stairs-Rails: Right Bathroom Shower/Tub: Walk-in shower;Door [employer] Toilet: Pharmacist, community: Yes How Accessible: Accessible via walker Home Adaptive Equipment: Straight cane Prior Function Level of Independence: Independent Able to Take Stairs?: Yes Driving: Yes Vocation: Full time employment Comments: sitting for work Musician: No difficulties Dominant Hand: Right         Vision/Perception     Cognition  Overall Cognitive Status: Appears within functional limits for tasks assessed/performed Arousal/Alertness: Awake/alert Orientation Level: Appears intact for tasks assessed Behavior During Session: [facility] for tasks performed    Extremity/Trunk Assessment Right Upper Extremity Assessment RUE ROM/Strength/Tone: WFL for tasks assessed RUE Sensation: WFL - Light Touch RUE Coordination: WFL - gross/fine motor Left Upper Extremity Assessment LUE ROM/Strength/Tone: WFL for tasks assessed LUE Sensation: WFL - Light Touch LUE  Coordination: WFL - gross/fine motor  Trunk Assessment Trunk Assessment: Normal     Mobility Bed Mobility Bed Mobility: Rolling Right;Right Sidelying to Sit;Sitting - Scoot to [employer] of Bed;Sit to Sidelying Right Rolling Right: 5: Supervision Right Sidelying to Sit: 5: Supervision;HOB flat Sitting - Scoot to Edge of Bed: 5: Supervision Sit to Sidelying Right: 5: Supervision;HOB flat Details for Bed Mobility Assistance: vc for techniques to maintain back precautions and limit pain; repeated all aspects x 2 and pt continued to require cues (wife able to provide) Transfers Sit to Stand: 5: Supervision;With upper extremity assist;From chair/3-in-1;From bed Stand to Sit: 5: Supervision;With upper extremity assist;To bed;To chair/3-in-1 Details for Transfer Assistance: able to stand with hands on his knees     Shoulder Instructions     Exercise Other Exercises Other Exercises: Pt with specific questions re: how to apply his back precautions to everyday activities, when he can advance his activity, car transfers (demonstrated technique for wife/pt), anatomy of his injury and repair and how this correlates to his restrictions. Reviewed best positioning and type of chairs to sit in at home. All questions answered or deferred to Dr. Venetia Maxon    Balance     End of Session OT - End of Session Activity Tolerance: Patient tolerated treatment well Patient left: in chair;with call bell/phone within reach;with family/visitor present Nurse Communication: Mobility status;Precautions  GO     Lucile Shutters 08/30/2012, 1:27 PM Pager: [phone]

## 2012-09-02 NOTE — Discharge Summary (Signed)
As above.

## 2013-02-04 ENCOUNTER — Encounter: Payer: BC Managed Care – PPO | Admitting: Pulmonary Disease

## 2013-03-04 ENCOUNTER — Ambulatory Visit: Payer: BC Managed Care – PPO | Admitting: Pulmonary Disease

## 2013-03-13 ENCOUNTER — Ambulatory Visit (INDEPENDENT_AMBULATORY_CARE_PROVIDER_SITE_OTHER): Payer: BC Managed Care – PPO | Admitting: Pulmonary Disease

## 2013-03-13 ENCOUNTER — Encounter: Payer: Self-pay | Admitting: Pulmonary Disease

## 2013-03-13 VITALS — BP 128/82 | HR 81 | Temp 98.6°F | Ht 68.0 in | Wt 146.4 lb

## 2013-03-13 DIAGNOSIS — Z9889 Other specified postprocedural states: Secondary | ICD-10-CM

## 2013-03-13 DIAGNOSIS — E785 Hyperlipidemia, unspecified: Secondary | ICD-10-CM

## 2013-03-13 DIAGNOSIS — J309 Allergic rhinitis, unspecified: Secondary | ICD-10-CM

## 2013-03-13 DIAGNOSIS — I10 Essential (primary) hypertension: Secondary | ICD-10-CM

## 2013-03-13 DIAGNOSIS — Z8739 Personal history of other diseases of the musculoskeletal system and connective tissue: Secondary | ICD-10-CM

## 2013-03-13 DIAGNOSIS — D126 Benign neoplasm of colon, unspecified: Secondary | ICD-10-CM

## 2013-03-13 MED ORDER — AMLODIPINE BESY-BENAZEPRIL HCL 5-10 MG PO CAPS: 1.0000 | ORAL_CAPSULE | Freq: Every day | ORAL | Status: DC

## 2013-03-13 NOTE — Progress Notes (Signed)
Subjective:     Patient ID: Raymond Hayes, male   DOB: 1960/08/20, 52 y.o.   MRN: [id number]  HPI 52 y/o WM here for a follow up visit due to HBP...   ~  July 29, 2010:  he has been on Vasotec 5mg /d for mild HBP w/ good control... recently BP elev at home ~180/90 by report assoc w/ fatigue, dizziness, etc... we increased Vasotec to 5mg  Bid by phone & BPs improved- he brings home med list w/ pressures up to 146/ 86 on this dose however... we discussed ch Rx to once daily pill> try LOTREL 5/10 daily...  ~  February 13, 2012:  5mo ROV & Jamez continues to do well> no new complaints or concerns;  BP remains well controlled on Lotrel 5-10 & he is tolerating very well;  We reviewed prev XRays & labs- he is not fasting today & wants to wait til next yr for follow up CPX & he is low risk...    We reviewed prob list, meds, xrays and labs> see below>>  ~  April 09, 2012:  35mo ROV & add-on for dizziness x 3wks "not feeling like myself" plus mult somatic complaints including transient episode of blurred vision, occipital HA, "flu-like feeling", unsteady- "about 10%" he says, then tired/ exhausted==> gradually better now;  Also c/o paresthesias w/ a "cold sensation" in his feet, similar senation in his knees intermittently, not really a numbness & no prob at night in bed- just when he is up & about, not a pain & no radiation, etc; this disturbance is really bothering him but I can't find any objective abn- so we discussed Neuro eval & we will refer...     We reviewed prob list, meds, xrays and labs> see below for updates >> LABS 8/13:  FLP- ok x LDL=121;  Chems- all wnl;  CBC- wnl;  TSH=1.12;  PSA= 0.42;  Sed=8...   ~  March 13, 2013:  53mo ROV & review> after his last visit 8/13 Harvie Heck saw DrPenumalli for Neuro & he did MRI Brain (said to be normal- report not in Epic) but symptoms persisted & progressed; they eventually did MRI L/S spine which was also reported neg; pt was frustrated due to continued & worsening  symptoms, and finally had CT of his TSpine w/ severe T7-8 herniated disc w/ myelopathy from cord compression; he does not know how this occurred- much of the disc was calcified as if in an old injury but he denied hx trauma other than a minor jarring on his lawnmower... DrStern did left T7-8 transpedicular discectomy w/ spinal cord monitoring & microdissection => by all accounts he has done beautifully since then...    AR> minor allergy symptoms treated w/ OTC antihist prn...    HBP> on Lotrel5-10; BP= 128/82 and similar at home; denies HA, dizzy, CP, palpit, SOB, edema...    Dyslipidemia> he does not want meds, on diet alone; last FLP 8/13 showed TChol 191, TG 61, HDL 58, LDL 121 & he is reassured...    Colon polyps, Hems> he had screening colon 6/11 at age 52; DrGessner removed a 5mm adenoma & f/u is suggested in 21yrs...     Thoracic herniated disc w/ myelopathy> s/p surg by DrStern 1/14 & he is much improved; he is back to 80-90% activity level...  We reviewed prob list, meds, xrays and labs> see below for updates >>  CXR 1/14 showed norm heart size, clear lungs, NAD.Marland KitchenMarland Kitchen  EKG 1/14 showed NSR w/ PACs,  rate65, otherw wnl... LABS 1/14:  Chems- wnl;  CBC- wnl          Problem List:   ALLERGIC RHINITIS (ICD-477.9) - hx allergic rhinitis symptoms treated OTC Prn... never had allergy testing etc...  HYPERTENSION (ICD-401.9) - hx mild HBP prev controlled w/ Vasotec 5mg /d...  ~  4/11:  BP= 122/80 today & similar at home... tolerates med well & denies HA, fatigue, visual changes, CP, palipit, dizziness, syncope, dyspnea, edema, etc...  ~  12/11:  pt called w/ incr BP at home up to 180/90 & we decided to ch to LOTREL 5/10 daily... ~  7/13:  BP= 130/84 & he remains asymptomatic... ~  7/14: on Lotrel5-10; BP= 128/82 and similar at home; denies HA, dizzy, CP, palpit, SOB, edema...  HYPERCHOLESTEROLEMIA, BORDERLINE (ICD-272.4) - hx mild elevations of TChol & LDL in the past... controlled on diet Rx  alone... ~  FLP 4/04 showed TChol 236, TG 64, HDL 56, LDL 167 ~  FLP 8/06 showed TChol 175, TG 43, HDL 49, LDL 118 ~  FLP 11/08 showed TChol 213, TG 39, HDL 63, LDL 136 ~  FLP 4/11 showed TChol 172, TG 56, HDL 53, LDL 107 ~  FLP 8/13 showed TChol 191, TG 61, HDL 58, LDL 121... He does not want Statin meds, therefore diet rx. ~  7/14: he declines FLP recheck at this time; discussed continued diet, exercise...  COLONIC POLYPS (ICD-211.3) - he had routine colonoscopy by DrGessner 6/11 at age 29 > showing 5mm polyp in rectum & bx= tub adenoma, & int hem... HEMORRHOIDS (ICD-455.6) - he has ANUSOL HC cream for Prn use...  T7-8 THORACIC DISC HERNIATION w/ MYELOPATHY >>  ~  8/13:  mult somatic complaints including transient episode of blurred vision, occipital HA, "flu-like feeling", unsteady- "about 10%" he says, then tired/ exhausted==> gradually better now;  Also c/o paresthesias w/ a "cold sensation" in his feet, similar senation in his knees intermittently, not really a numbness & no prob at night in bed- just when he is up & about, not a pain & no radiation, etc; this disturbance is really bothering him but I can't find any objective abn on exam- we discussed Neuro eval & we will refer...  ~  9-12/13: Harvie Heck saw DrPenumalli for Neuro & he did MRI Brain (said to be normal- report not in Epic) but symptoms persisted & progressed; they eventually did MRI L/S spine which was also reported neg; pt was frustrated due to continued & worsening symptoms, and finally had CT of his TSpine w/ severe T7-8 herniated disc w/ myelopathy from cord compression; he does not know how this occurred- much of the disc was calcified as if in an old injury but he denied hx trauma other than a minor jarring on his lawnmower...  ~  1/14:  DrStern did left T7-8 transpedicular discectomy w/ spinal cord monitoring & microdissection => by all accounts he has done beautifully since then...   Past Surgical History  Procedure  Laterality Date  . Thoracic discectomy  08/29/2012    Procedure: THORACIC DISCECTOMY;  Surgeon: Maeola Harman, MD;  Location: [facility];  Service: Neurosurgery;  Laterality: Left;  Left Thoracic seven-eight Transpedicular Diskectomy with Spinal cord monitering    Outpatient Encounter Prescriptions as of 03/13/2013  Medication Sig Dispense Refill  . amLODipine-benazepril (LOTREL) 5-10 MG per capsule Take 1 capsule by mouth daily.  90 capsule  3  . Ascorbic Acid (VITAMIN C PO) Take 1 tablet by mouth daily.      Marland Kitchen  aspirin EC 81 MG tablet Take 81 mg by mouth daily.      . Multiple Vitamin (MULTIVITAMIN) tablet Take 1 tablet by mouth daily.       No facility-administered encounter medications on file as of 03/13/2013.    No Known Allergies   Current Medications, Allergies, Past Medical History, Past Surgical History, Family History, and Social History were reviewed in [employer] record.   Review of Systems        See HPI - all other systems neg except as noted...  The patient denies anorexia, fever, weight loss, weight gain, vision loss, decreased hearing, hoarseness, chest pain, syncope, dyspnea on exertion, peripheral edema, prolonged cough, headaches, hemoptysis, abdominal pain, melena, hematochezia, severe indigestion/heartburn, hematuria, incontinence, muscle weakness, suspicious skin lesions, transient blindness, difficulty walking, depression, unusual weight change, abnormal bleeding, enlarged lymph nodes, and angioedema.     Objective:   Physical Exam     WD, WN, 52 y/o WM in NAD... GENERAL:  Alert & oriented; pleasant & cooperative. HEENT:  Oval/AT, EOM-wnl, PERRLA, Fundi-benign, EACs-clear, TMs-wnl, NOSE-clear, THROAT-clear & wnl. NECK:  Supple w/ full ROM; no JVD; normal carotid impulses w/o bruits; no thyromegaly or nodules palpated; no lymphadenopathy. CHEST:  Clear to P & A; without wheezes/ rales/ or rhonchi. HEART:  Regular Rhythm; without murmurs/  rubs/ or gallops. ABDOMEN:  Soft & nontender; normal bowel sounds; no organomegaly or masses detected. (RECTAL:  Neg - prostate 2+ & nontender w/o nodules; stool hematest neg) EXT: without deformities or arthritic changes; no varicose veins/ venous insuffic/ or edema. BACK:  sm scar in mid thoracic area- well healed... NEURO:  CN's intact; motor testing normal; sensory testing normal; gait normal & balance OK. DERM:  No lesions noted; no rash etc...  RADIOLOGY DATA:  Reviewed in the EPIC EMR & discussed w/ the patient...  LABORATORY DATA:  Reviewed in the EPIC EMR & discussed w/ the patient...   Assessment:      HBP>  On Lotrel 5-10 w/ good control, tolerated well, continue same Rx...  Chol>  He has mild elev LDL in the past but doesn't want meds; "I'm a diet guy" he says; his weight is normal w/ BMI~22...  Hx Colon Polyps and Hems>  He had colonoscopy 2011 by DrGessner w/ sm adenoma removed; has AnusolHC for prn use...  S/P T7-8 herniated disc w/ myelopathy> he has done beautifully since the surgery by DrStern...     Plan:     Patient's Medications  New Prescriptions   No medications on file  Previous Medications   ASCORBIC ACID (VITAMIN C PO)    Take 1 tablet by mouth daily.   ASPIRIN EC 81 MG TABLET    Take 81 mg by mouth daily.   MULTIPLE VITAMIN (MULTIVITAMIN) TABLET    Take 1 tablet by mouth daily.  Modified Medications   Modified Medication Previous Medication   AMLODIPINE-BENAZEPRIL (LOTREL) 5-10 MG PER CAPSULE amLODipine-benazepril (LOTREL) 5-10 MG per capsule      Take 1 capsule by mouth daily.    Take 1 capsule by mouth daily.  Discontinued Medications   No medications on file

## 2013-03-13 NOTE — Patient Instructions (Addendum)
Today we updated your med list in our EPIC system...    Continue your current medications the same...    We refilled your Lotrel per request...  Gabor, we are glad you are doing so well...  Call for any questions...  Let's plan a follow up visit in 86yr, sooner if needed for problems.Marland KitchenMarland Kitchen

## 2013-04-21 ENCOUNTER — Other Ambulatory Visit: Payer: Self-pay | Admitting: Pulmonary Disease

## 2013-07-16 ENCOUNTER — Other Ambulatory Visit: Payer: Self-pay | Admitting: Dermatology

## 2013-08-01 ENCOUNTER — Ambulatory Visit: Payer: BC Managed Care – PPO | Admitting: Pulmonary Disease

## 2013-12-08 IMAGING — CT CT T SPINE W/O CM
3 series · 16 of 33 positions shown, 19 images · non-contrast
Comparison: Radiographs dated 07/29/2012

CLINICAL DATA: Thoracic disc disorder with myelopathy.

CT THORACIC SPINE WITHOUT CONTRAST
TECHNIQUE: Multidetector CT imaging of the thoracic spine was
performed without intravenous contrast administration. Multiplanar
CT image reconstructions were also generated

[Series 103: cor t spine · coronal · 0.68mm/px · 3 of 70 slices shown]
[im 14/70  bone]
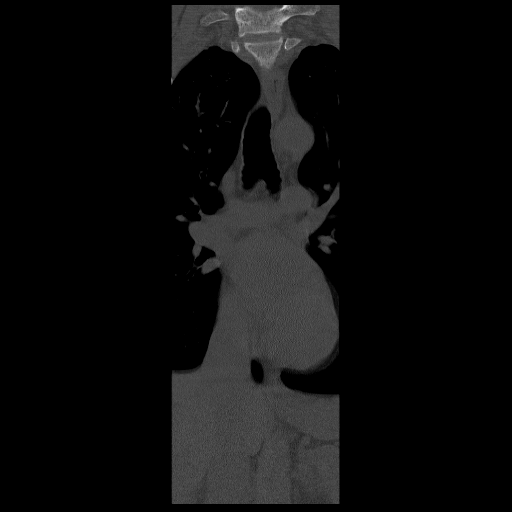
[im 28/70  bone]
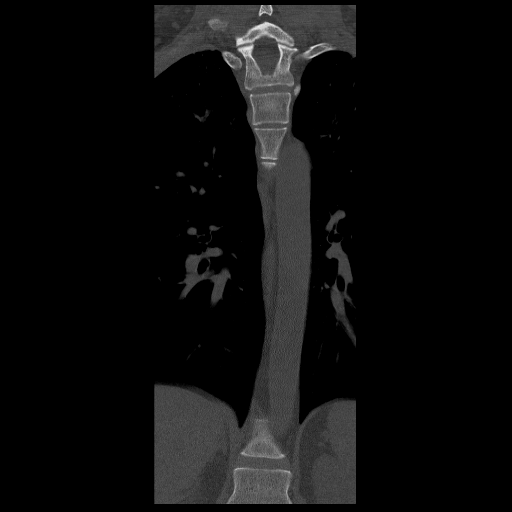
[im 42/70  bone]
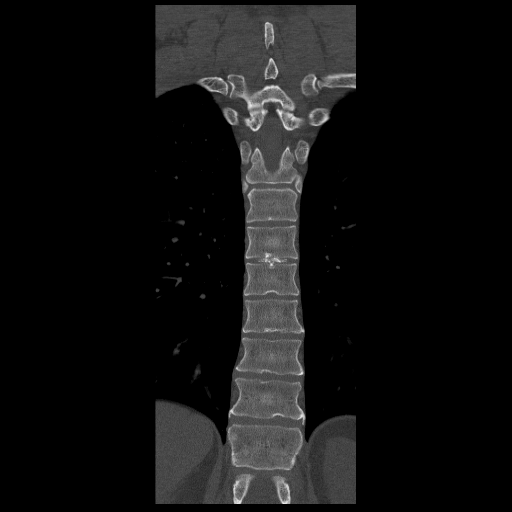

[Series 104: sag t spine · sagittal · 0.68mm/px · 5 of 44 slices shown, 6 images]
[im 15/44  bone]
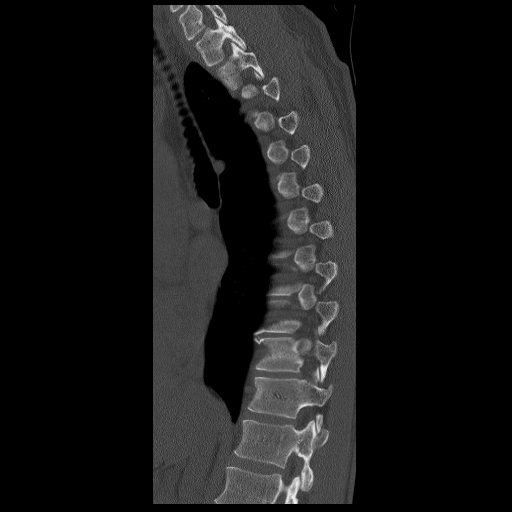
[im 18/44  bone]
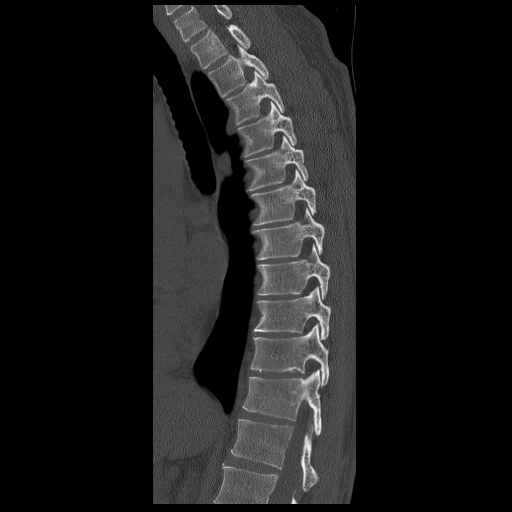
[im 22/44  soft-tissue]
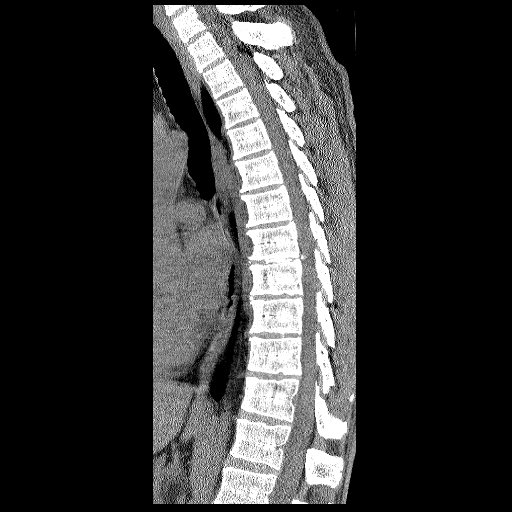
[im 22/44  bone]
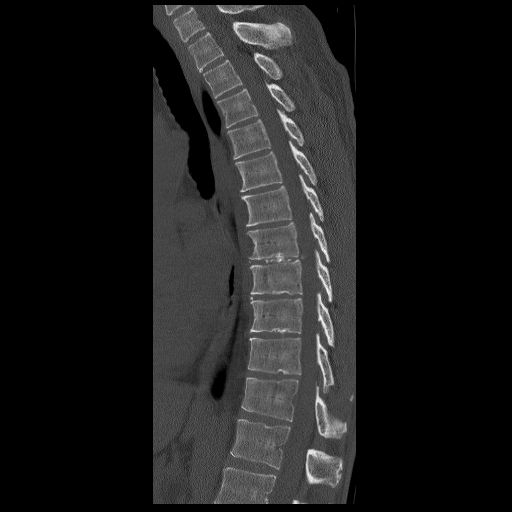
[im 26/44  bone]
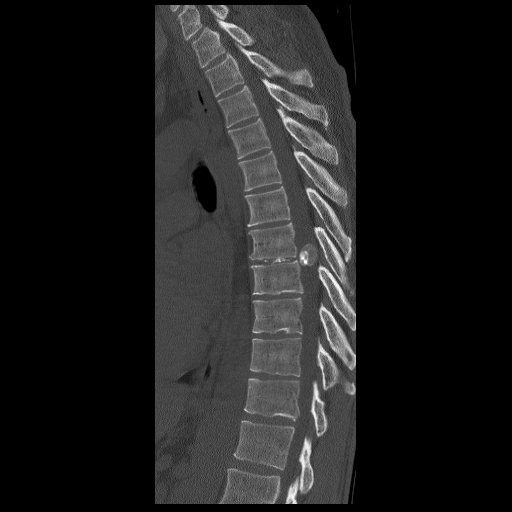
[im 29/44  bone]
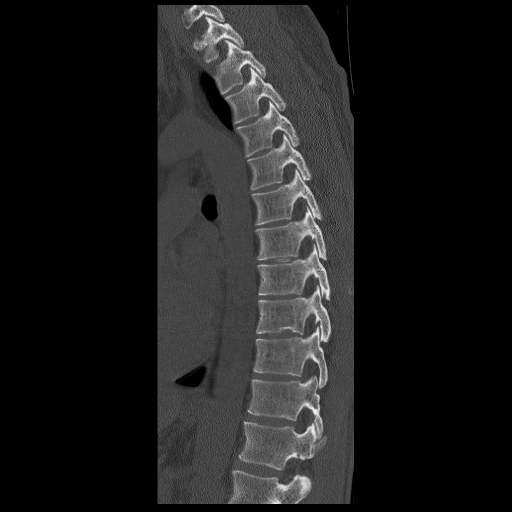

[Series 301: t spine soft · axial · 0.27mm/px · z∈[-317,-32]mm · 8 of 136 slices shown, 10 images]
[im 11/136  soft-tissue]
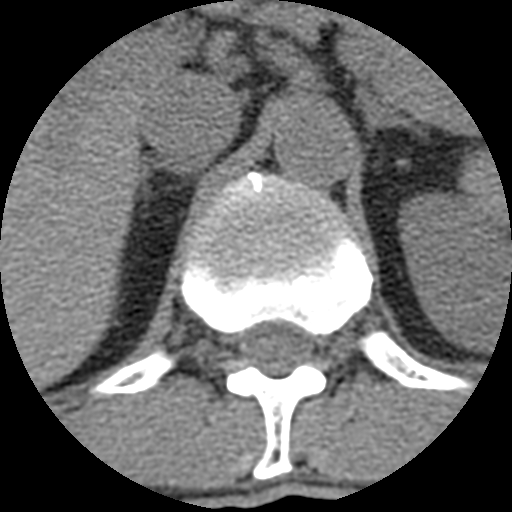
[im 11/136  bone]
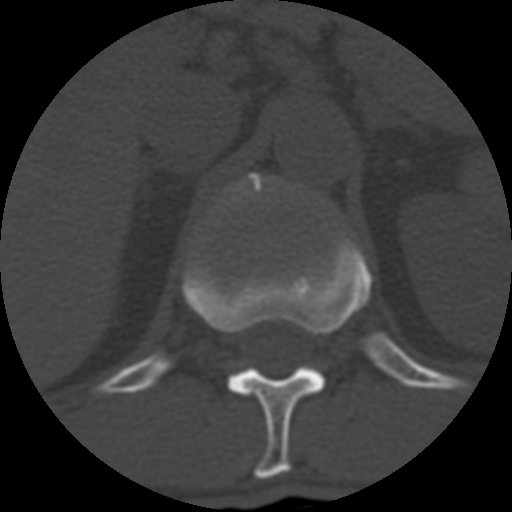
[im 32/136  bone]
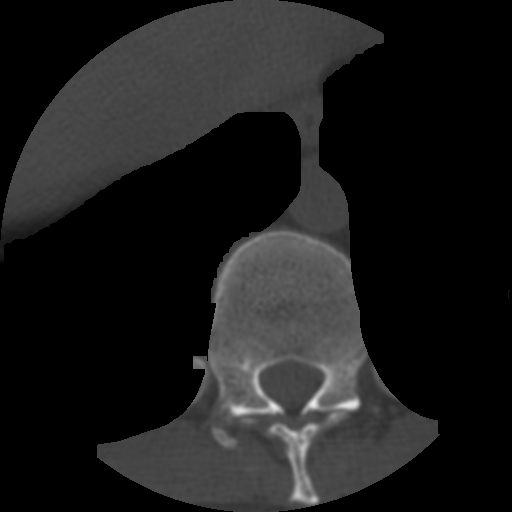
[im 42/136  bone]
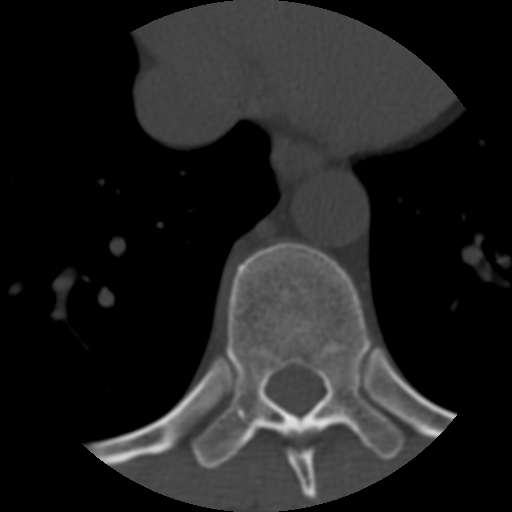
[im 63/136  bone]
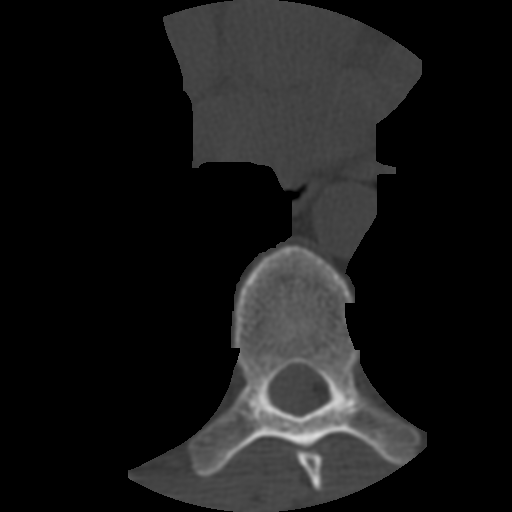
[im 73/136  soft-tissue]
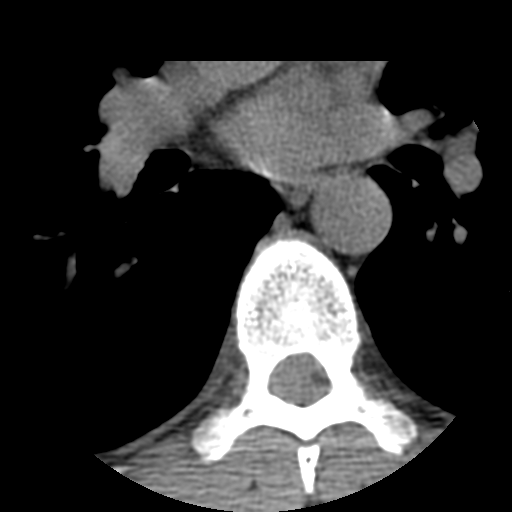
[im 73/136  bone]
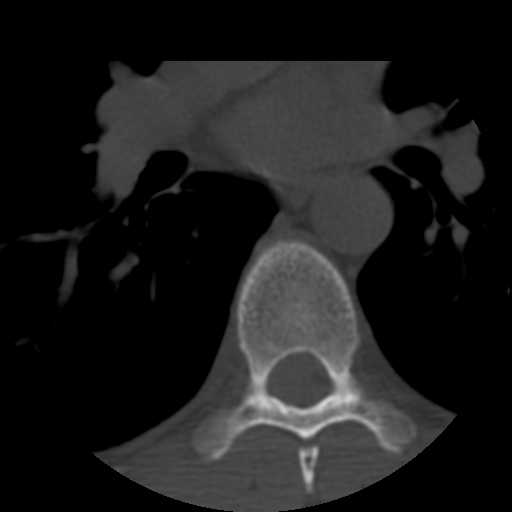
[im 94/136  bone]
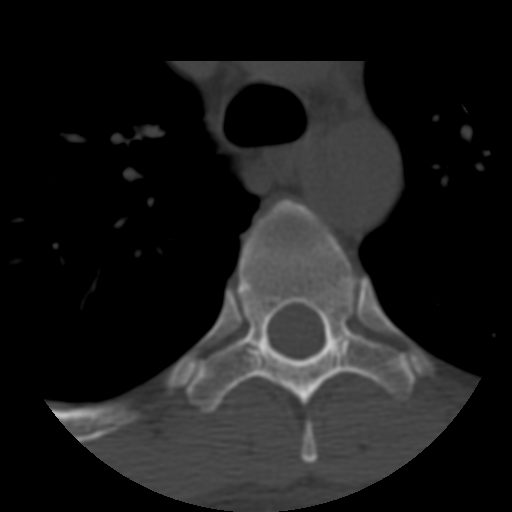
[im 104/136  bone]
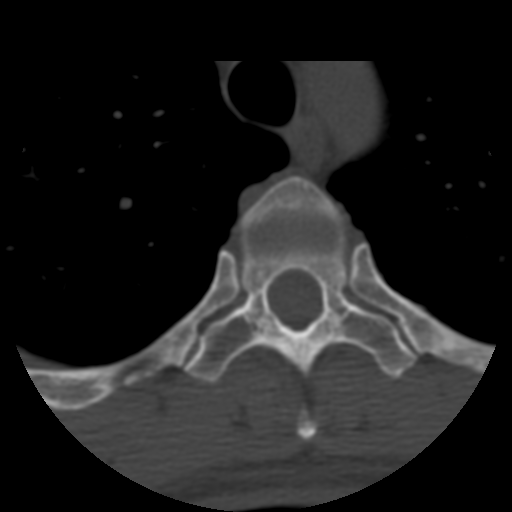
[im 125/136  bone]
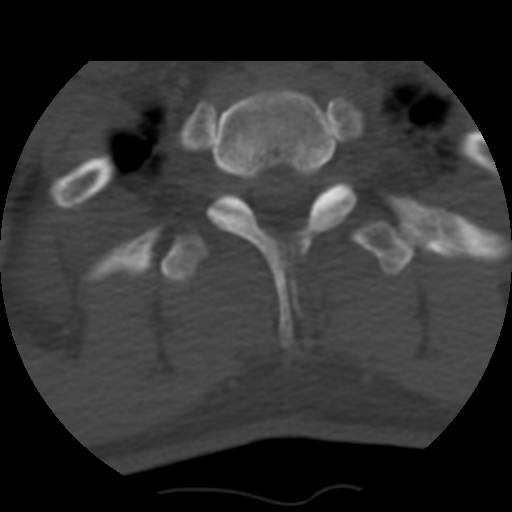

[16 of 33 positions shown; findings below may reference images not displayed]

FINDINGS: There is a giant calcified thoracic disc herniation at T7-
8.  The calcified disc extrusion measures 14 x 14 x 12 mm.  It
severely compresses the thecal sac and compresses the spinal cord
to the right.  There is calcification within the disc space well.

There is a tiny calcified disc bulge at T8-9 into the left lateral
recess.

The remainder of the thoracic spine appears normal.  There is no
spinal or foraminal stenosis elsewhere.  No disc space narrowing or
fractures.  Paraspinal soft tissues are normal.
IMPRESSION: Giant calcified thoracic disc herniation at T7-8 severely
compressing the thecal sac and spinal cord.  The spinal cord is
deviated to the right.

## 2014-01-05 IMAGING — CR DG THORACIC SPINE 2V
1 series · 1 of 1 positions shown · non-contrast
Comparison: CT 08/01/2012

CLINICAL DATA: Transpedicular discectomy

THORACIC SPINE - 2 VIEW

[view not recorded]
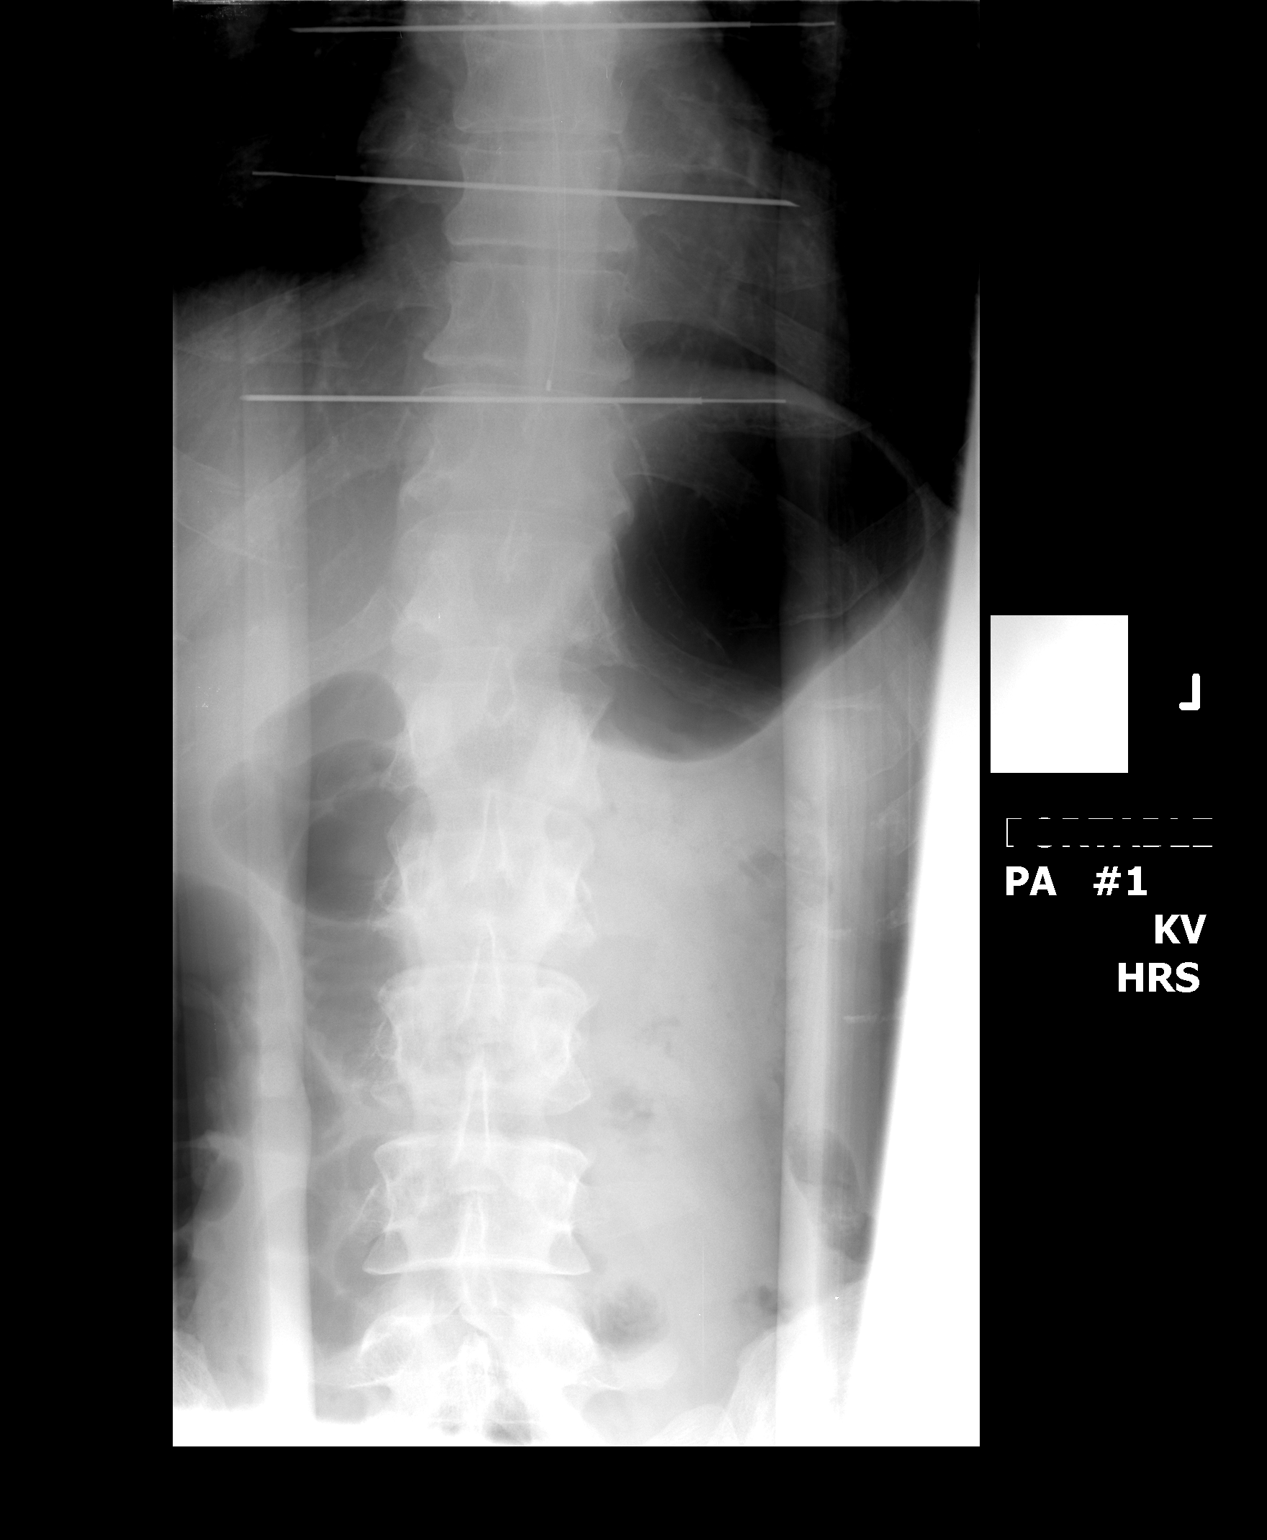

[1 of 1 positions shown; findings below may reference images not displayed]

FINDINGS: Two intraoperative images are submitted.  The first
demonstrates metallic markers overlying T9 and T11 vertebral
bodies.  Second image show surgical instruments projecting at the
T7-8 interspace.
IMPRESSION: Intraoperative localization

## 2014-03-09 ENCOUNTER — Other Ambulatory Visit: Payer: Self-pay | Admitting: Pulmonary Disease

## 2014-03-16 ENCOUNTER — Ambulatory Visit: Payer: BC Managed Care – PPO | Admitting: Pulmonary Disease

## 2014-05-30 ENCOUNTER — Other Ambulatory Visit: Payer: Self-pay | Admitting: Pulmonary Disease

## 2014-08-28 ENCOUNTER — Other Ambulatory Visit: Payer: Self-pay | Admitting: Pulmonary Disease

## 2015-03-01 ENCOUNTER — Encounter: Payer: Self-pay | Admitting: Internal Medicine

## 2015-03-02 ENCOUNTER — Encounter: Payer: Self-pay | Admitting: Internal Medicine

## 2015-07-21 ENCOUNTER — Encounter: Payer: Self-pay | Admitting: Internal Medicine

## 2016-06-08 ENCOUNTER — Encounter: Payer: Self-pay | Admitting: Internal Medicine

## 2016-08-11 ENCOUNTER — Ambulatory Visit (AMBULATORY_SURGERY_CENTER): Payer: Self-pay | Admitting: *Deleted

## 2016-08-11 VITALS — Ht 68.0 in | Wt 157.0 lb

## 2016-08-11 DIAGNOSIS — Z86010 Personal history of colonic polyps: Secondary | ICD-10-CM

## 2016-08-11 NOTE — Progress Notes (Signed)
No allergies to eggs or soy. No problems with anesthesia.  Pt given Emmi instructions for colonoscopy  No oxygen use  No diet drug use  

## 2016-08-25 ENCOUNTER — Encounter: Payer: BLUE CROSS/BLUE SHIELD | Admitting: Internal Medicine

## 2016-09-22 ENCOUNTER — Ambulatory Visit (AMBULATORY_SURGERY_CENTER): Payer: BLUE CROSS/BLUE SHIELD | Admitting: Internal Medicine

## 2016-09-22 ENCOUNTER — Encounter: Payer: Self-pay | Admitting: Internal Medicine

## 2016-09-22 VITALS — BP 98/56 | HR 66 | Temp 98.6°F | Resp 20 | Ht 68.0 in | Wt 146.0 lb

## 2016-09-22 DIAGNOSIS — Z86010 Personal history of colonic polyps: Secondary | ICD-10-CM

## 2016-09-22 MED ORDER — SODIUM CHLORIDE 0.9 % IV SOLN: 500.0000 mL | INTRAVENOUS | Status: AC

## 2016-09-22 NOTE — Op Note (Signed)
Raymond Hayes Patient Name: Raymond Hayes Procedure Date: 09/22/2016 2:30 PM MRN: [id number] Endoscopist: Gatha Mayer , MD Age: 56 Referring MD:  Date of Birth: April 18, 1961 Gender: Male Account #: [id number] Procedure:                Colonoscopy Indications:              Surveillance: Personal history of adenomatous                            polyps on last colonoscopy > 5 years ago, Last                            colonoscopy: 2011 Medicines:                Propofol per Anesthesia, Monitored Anesthesia Care Procedure:                Pre-Anesthesia Assessment:                           - Prior to the procedure, a History and Physical                            was performed, and patient medications and                            allergies were reviewed. The patient's tolerance of                            previous anesthesia was also reviewed. The risks                            and benefits of the procedure and the sedation                            options and risks were discussed with the patient.                            All questions were answered, and informed consent                            was obtained. Prior Anticoagulants: The patient has                            taken no previous anticoagulant or antiplatelet                            agents. ASA Grade Assessment: II - A patient with                            mild systemic disease. After reviewing the risks                            and benefits, the patient was deemed in  satisfactory condition to undergo the procedure.                           After obtaining informed consent, the colonoscope                            was passed under direct vision. Throughout the                            procedure, the patient's blood pressure, pulse, and                            oxygen saturations were monitored continuously. The                            Model CF-[id number])  scope was introduced                            through the anus and advanced to the the cecum,                            identified by appendiceal orifice and ileocecal                            valve. The colonoscopy was performed without                            difficulty. The patient tolerated the procedure                            well. The quality of the bowel preparation was                            excellent. The bowel preparation used was Miralax.                            The ileocecal valve, appendiceal orifice, and                            rectum were photographed. Scope In: 2:39:10 PM Scope Out: 2:49:23 PM Scope Withdrawal Time: 0 hours 6 minutes 22 seconds  Total Procedure Duration: 0 hours 10 minutes 13 seconds  Findings:                 The perianal and digital rectal examinations were                            normal. Pertinent negatives include normal prostate                            (size, shape, and consistency).                           The entire examined colon appeared normal on direct  and retroflexion views. Complications:            No immediate complications. Estimated Blood Loss:     Estimated blood loss: none. Impression:               - The entire examined colon is normal on direct and                            retroflexion views.                           - No specimens collected.                           - Personal history of colonic polyps - diminutive                            adenoma 2011. Recommendation:           - Patient has a contact number available for                            emergencies. The signs and symptoms of potential                            delayed complications were discussed with the                            patient. Return to normal activities tomorrow.                            Written discharge instructions were provided to the                            patient.                            - Resume previous diet.                           - Repeat colonoscopy/other screening test in 10                            years.                           - Continue present medications. Gatha Mayer, MD 09/22/2016 2:54:48 PM This report has been signed electronically.

## 2016-09-22 NOTE — Progress Notes (Signed)
Report to PACU, RN, vss, BBS= Clear.  

## 2016-09-22 NOTE — Patient Instructions (Addendum)
   No polyps today!  Next routine colonoscopy in 10 years - 2028  I appreciate the opportunity to care for you. Gatha Mayer, MD, FACG   YOU HAD AN ENDOSCOPIC PROCEDURE TODAY AT [facility]:   Refer to the procedure report that was given to you for any specific questions about what was found during the examination.  If the procedure report does not answer your questions, please call your gastroenterologist to clarify.  If you requested that your care partner not be given the details of your procedure findings, then the procedure report has been included in a sealed envelope for you to review at your convenience later.  YOU SHOULD EXPECT: Some feelings of bloating in the abdomen. Passage of more gas than usual.  Walking can help get rid of the air that was put into your GI tract during the procedure and reduce the bloating. If you had a lower endoscopy (such as a colonoscopy or flexible sigmoidoscopy) you may notice spotting of blood in your stool or on the toilet paper. If you underwent a bowel prep for your procedure, you may not have a normal bowel movement for a few days.  Please Note:  You might notice some irritation and congestion in your nose or some drainage.  This is from the oxygen used during your procedure.  There is no need for concern and it should clear up in a day or so.  SYMPTOMS TO REPORT IMMEDIATELY:   Following lower endoscopy (colonoscopy or flexible sigmoidoscopy):  Excessive amounts of blood in the stool  Significant tenderness or worsening of abdominal pains  Swelling of the abdomen that is new, acute  Fever of 100F or higher    For urgent or emergent issues, a gastroenterologist can be reached at any hour by calling [phone].   DIET:  We do recommend a small meal at first, but then you may proceed to your regular diet.  Drink plenty of fluids but you should avoid alcoholic beverages for 24 hours.  ACTIVITY:  You should plan to  take it easy for the rest of today and you should NOT DRIVE or use heavy machinery until tomorrow (because of the sedation medicines used during the test).    FOLLOW UP: Our staff will call the number listed on your records the next business day following your procedure to check on you and address any questions or concerns that you may have regarding the information given to you following your procedure. If we do not reach you, we will leave a message.  However, if you are feeling well and you are not experiencing any problems, there is no need to return our call.  We will assume that you have returned to your regular daily activities without incident.  If any biopsies were taken you will be contacted by phone or by letter within the next 1-3 weeks.  Please call us at [phone] if you have not heard about the biopsies in 3 weeks.    SIGNATURES/CONFIDENTIALITY: You and/or your care partner have signed paperwork which will be entered into your electronic medical record.  These signatures attest to the fact that that the information above on your After Visit Summary has been reviewed and is understood.  Full responsibility of the confidentiality of this discharge information lies with you and/or your care-partner.

## 2016-09-25 ENCOUNTER — Telehealth: Payer: Self-pay | Admitting: *Deleted

## 2016-09-25 NOTE — Telephone Encounter (Signed)
  Follow up Call-  Call back number 09/22/2016  Post procedure Call Back phone  # [phone]  Permission to leave phone message Yes  Some recent data might be hidden     Patient questions:  Do you have a fever, pain , or abdominal swelling? No. Pain Score  0 *  Have you tolerated food without any problems? Yes.    Have you been able to return to your normal activities? Yes.    Do you have any questions about your discharge instructions: Diet   No. Medications  No. Follow up visit  No.  Do you have questions or concerns about your Care? No.  Actions: * If pain score is 4 or above: No action needed, pain <4.

## 2020-12-10 ENCOUNTER — Other Ambulatory Visit: Payer: Self-pay | Admitting: Orthopedic Surgery
